# Patient Record
Sex: Female | Born: 1935 | Hispanic: Refuse to answer | Marital: Married | Smoking: Never smoker
Health system: Southern US, Community
[De-identification: ages and names within clinical notes are randomized; demographics above are authoritative.]

## PROBLEM LIST (undated history)

## (undated) DIAGNOSIS — I10 Essential (primary) hypertension: Secondary | ICD-10-CM

## (undated) DIAGNOSIS — E7800 Pure hypercholesterolemia, unspecified: Secondary | ICD-10-CM

## (undated) DIAGNOSIS — F0390 Unspecified dementia without behavioral disturbance: Secondary | ICD-10-CM

---

## 2004-04-03 ENCOUNTER — Ambulatory Visit: Payer: Self-pay | Admitting: Gastroenterology

## 2004-06-21 ENCOUNTER — Ambulatory Visit: Payer: Self-pay | Admitting: Internal Medicine

## 2004-12-20 ENCOUNTER — Ambulatory Visit: Payer: Self-pay | Admitting: Ophthalmology

## 2005-08-07 ENCOUNTER — Ambulatory Visit: Payer: Self-pay | Admitting: Internal Medicine

## 2005-09-23 ENCOUNTER — Ambulatory Visit: Payer: Self-pay | Admitting: Gerontology

## 2005-10-11 ENCOUNTER — Ambulatory Visit: Payer: Self-pay | Admitting: Internal Medicine

## 2006-11-06 ENCOUNTER — Ambulatory Visit: Payer: Self-pay | Admitting: Internal Medicine

## 2006-12-09 ENCOUNTER — Ambulatory Visit: Payer: Self-pay | Admitting: Cardiology

## 2007-04-07 ENCOUNTER — Ambulatory Visit: Payer: Self-pay | Admitting: Gastroenterology

## 2007-05-13 ENCOUNTER — Ambulatory Visit: Payer: Self-pay | Admitting: Internal Medicine

## 2007-11-27 ENCOUNTER — Ambulatory Visit: Payer: Self-pay

## 2008-02-24 ENCOUNTER — Ambulatory Visit: Payer: Self-pay | Admitting: Internal Medicine

## 2009-03-07 ENCOUNTER — Ambulatory Visit: Payer: Self-pay | Admitting: Internal Medicine

## 2010-01-16 ENCOUNTER — Ambulatory Visit: Payer: Self-pay | Admitting: Pain Medicine

## 2010-01-29 ENCOUNTER — Ambulatory Visit: Payer: Self-pay | Admitting: Pain Medicine

## 2010-02-08 ENCOUNTER — Ambulatory Visit: Payer: Self-pay | Admitting: Pain Medicine

## 2010-02-20 ENCOUNTER — Emergency Department: Payer: Self-pay | Admitting: Emergency Medicine

## 2010-02-20 ENCOUNTER — Ambulatory Visit: Payer: Self-pay | Admitting: Pain Medicine

## 2010-04-10 ENCOUNTER — Ambulatory Visit: Payer: Self-pay | Admitting: Ophthalmology

## 2010-05-08 ENCOUNTER — Ambulatory Visit: Payer: Self-pay | Admitting: Ophthalmology

## 2010-05-22 ENCOUNTER — Ambulatory Visit: Payer: Self-pay | Admitting: Cardiovascular Disease

## 2010-05-22 ENCOUNTER — Ambulatory Visit: Payer: Self-pay | Admitting: Ophthalmology

## 2010-10-05 ENCOUNTER — Ambulatory Visit: Payer: Self-pay | Admitting: Internal Medicine

## 2013-06-04 ENCOUNTER — Ambulatory Visit: Payer: Self-pay | Admitting: Internal Medicine

## 2016-11-13 ENCOUNTER — Other Ambulatory Visit: Payer: Self-pay | Admitting: Physician Assistant

## 2016-11-13 DIAGNOSIS — R1032 Left lower quadrant pain: Principal | ICD-10-CM

## 2016-11-13 DIAGNOSIS — R1031 Right lower quadrant pain: Secondary | ICD-10-CM

## 2016-11-19 ENCOUNTER — Ambulatory Visit
Admission: RE | Admit: 2016-11-19 | Discharge: 2016-11-19 | Disposition: A | Discharge status: Home / Self Care | Payer: Medicare HMO | Source: Ambulatory Visit | Attending: Internal Medicine | Admitting: Internal Medicine

## 2016-11-19 ENCOUNTER — Other Ambulatory Visit: Payer: Self-pay | Admitting: Internal Medicine

## 2016-11-19 ENCOUNTER — Other Ambulatory Visit: Payer: Self-pay | Admitting: Physician Assistant

## 2016-11-19 DIAGNOSIS — I2510 Atherosclerotic heart disease of native coronary artery without angina pectoris: Secondary | ICD-10-CM | POA: Diagnosis not present

## 2016-11-19 DIAGNOSIS — K5730 Diverticulosis of large intestine without perforation or abscess without bleeding: Secondary | ICD-10-CM | POA: Insufficient documentation

## 2016-11-19 DIAGNOSIS — R1031 Right lower quadrant pain: Secondary | ICD-10-CM | POA: Insufficient documentation

## 2016-11-19 DIAGNOSIS — M419 Scoliosis, unspecified: Secondary | ICD-10-CM | POA: Diagnosis not present

## 2016-11-19 DIAGNOSIS — I700 Atherosclerosis of aorta: Secondary | ICD-10-CM | POA: Diagnosis not present

## 2016-11-19 DIAGNOSIS — R1032 Left lower quadrant pain: Principal | ICD-10-CM

## 2016-11-19 DIAGNOSIS — R1011 Right upper quadrant pain: Secondary | ICD-10-CM

## 2016-11-20 ENCOUNTER — Ambulatory Visit: Payer: Medicare HMO

## 2016-12-02 ENCOUNTER — Ambulatory Visit: Payer: Self-pay

## 2017-10-27 ENCOUNTER — Ambulatory Visit
Admission: RE | Admit: 2017-10-27 | Discharge: 2017-10-27 | Disposition: A | Discharge status: Home / Self Care | Payer: Medicare HMO | Source: Ambulatory Visit | Attending: Internal Medicine | Admitting: Internal Medicine

## 2017-10-27 ENCOUNTER — Other Ambulatory Visit: Payer: Self-pay | Admitting: Internal Medicine

## 2017-10-27 DIAGNOSIS — R918 Other nonspecific abnormal finding of lung field: Secondary | ICD-10-CM | POA: Diagnosis not present

## 2017-10-27 DIAGNOSIS — I2510 Atherosclerotic heart disease of native coronary artery without angina pectoris: Secondary | ICD-10-CM | POA: Diagnosis not present

## 2017-10-27 DIAGNOSIS — I700 Atherosclerosis of aorta: Secondary | ICD-10-CM | POA: Diagnosis not present

## 2017-10-27 DIAGNOSIS — M2578 Osteophyte, vertebrae: Secondary | ICD-10-CM | POA: Diagnosis not present

## 2017-10-27 DIAGNOSIS — N323 Diverticulum of bladder: Secondary | ICD-10-CM | POA: Insufficient documentation

## 2017-10-27 DIAGNOSIS — I517 Cardiomegaly: Secondary | ICD-10-CM | POA: Diagnosis not present

## 2017-10-27 DIAGNOSIS — R1031 Right lower quadrant pain: Secondary | ICD-10-CM

## 2017-10-30 ENCOUNTER — Other Ambulatory Visit: Payer: Self-pay | Admitting: Internal Medicine

## 2017-10-30 DIAGNOSIS — R911 Solitary pulmonary nodule: Secondary | ICD-10-CM

## 2017-12-02 ENCOUNTER — Ambulatory Visit: Payer: Medicare HMO

## 2018-01-20 ENCOUNTER — Other Ambulatory Visit: Payer: Self-pay | Admitting: Physician Assistant

## 2018-01-20 ENCOUNTER — Ambulatory Visit
Admission: RE | Admit: 2018-01-20 | Discharge: 2018-01-20 | Disposition: A | Discharge status: Home / Self Care | Payer: Medicare HMO | Source: Ambulatory Visit | Attending: Physician Assistant | Admitting: Physician Assistant

## 2018-01-20 ENCOUNTER — Ambulatory Visit: Payer: Medicare HMO

## 2018-01-20 DIAGNOSIS — G319 Degenerative disease of nervous system, unspecified: Secondary | ICD-10-CM | POA: Insufficient documentation

## 2018-01-20 DIAGNOSIS — X58XXXA Exposure to other specified factors, initial encounter: Secondary | ICD-10-CM | POA: Diagnosis not present

## 2018-01-20 DIAGNOSIS — S0990XA Unspecified injury of head, initial encounter: Secondary | ICD-10-CM

## 2018-01-29 ENCOUNTER — Ambulatory Visit: Payer: Medicare HMO

## 2018-02-05 ENCOUNTER — Ambulatory Visit: Payer: Medicare HMO

## 2018-02-24 ENCOUNTER — Ambulatory Visit: Payer: Medicare HMO

## 2019-07-29 ENCOUNTER — Emergency Department: Payer: Medicare HMO

## 2019-07-29 ENCOUNTER — Encounter: Payer: Self-pay | Admitting: Emergency Medicine

## 2019-07-29 ENCOUNTER — Other Ambulatory Visit: Payer: Self-pay

## 2019-07-29 ENCOUNTER — Inpatient Hospital Stay
Admission: EM | Admit: 2019-07-29 | Discharge: 2019-08-03 | DRG: 558 | Disposition: A | Discharge status: Hospice | Payer: Medicare HMO | Attending: Internal Medicine | Admitting: Internal Medicine

## 2019-07-29 DIAGNOSIS — I2510 Atherosclerotic heart disease of native coronary artery without angina pectoris: Secondary | ICD-10-CM | POA: Diagnosis not present

## 2019-07-29 DIAGNOSIS — F0391 Unspecified dementia with behavioral disturbance: Secondary | ICD-10-CM | POA: Diagnosis not present

## 2019-07-29 DIAGNOSIS — E785 Hyperlipidemia, unspecified: Secondary | ICD-10-CM | POA: Diagnosis present

## 2019-07-29 DIAGNOSIS — M25559 Pain in unspecified hip: Secondary | ICD-10-CM | POA: Diagnosis not present

## 2019-07-29 DIAGNOSIS — W19XXXA Unspecified fall, initial encounter: Secondary | ICD-10-CM

## 2019-07-29 DIAGNOSIS — I10 Essential (primary) hypertension: Secondary | ICD-10-CM | POA: Diagnosis present

## 2019-07-29 DIAGNOSIS — R531 Weakness: Secondary | ICD-10-CM

## 2019-07-29 DIAGNOSIS — R296 Repeated falls: Secondary | ICD-10-CM | POA: Diagnosis present

## 2019-07-29 DIAGNOSIS — F03918 Unspecified dementia, unspecified severity, with other behavioral disturbance: Secondary | ICD-10-CM | POA: Diagnosis present

## 2019-07-29 DIAGNOSIS — K219 Gastro-esophageal reflux disease without esophagitis: Secondary | ICD-10-CM | POA: Diagnosis present

## 2019-07-29 DIAGNOSIS — E876 Hypokalemia: Secondary | ICD-10-CM | POA: Diagnosis not present

## 2019-07-29 DIAGNOSIS — E860 Dehydration: Secondary | ICD-10-CM | POA: Diagnosis present

## 2019-07-29 DIAGNOSIS — M6282 Rhabdomyolysis: Secondary | ICD-10-CM | POA: Diagnosis present

## 2019-07-29 DIAGNOSIS — F05 Delirium due to known physiological condition: Secondary | ICD-10-CM | POA: Diagnosis present

## 2019-07-29 DIAGNOSIS — R410 Disorientation, unspecified: Secondary | ICD-10-CM

## 2019-07-29 DIAGNOSIS — E7800 Pure hypercholesterolemia, unspecified: Secondary | ICD-10-CM | POA: Diagnosis present

## 2019-07-29 DIAGNOSIS — R627 Adult failure to thrive: Secondary | ICD-10-CM | POA: Diagnosis present

## 2019-07-29 DIAGNOSIS — Z66 Do not resuscitate: Secondary | ICD-10-CM | POA: Diagnosis present

## 2019-07-29 DIAGNOSIS — Z515 Encounter for palliative care: Secondary | ICD-10-CM | POA: Diagnosis not present

## 2019-07-29 DIAGNOSIS — M1611 Unilateral primary osteoarthritis, right hip: Secondary | ICD-10-CM | POA: Diagnosis present

## 2019-07-29 DIAGNOSIS — T796XXA Traumatic ischemia of muscle, initial encounter: Secondary | ICD-10-CM

## 2019-07-29 DIAGNOSIS — Z20822 Contact with and (suspected) exposure to covid-19: Secondary | ICD-10-CM | POA: Diagnosis present

## 2019-07-29 DIAGNOSIS — Z6827 Body mass index (BMI) 27.0-27.9, adult: Secondary | ICD-10-CM

## 2019-07-29 DIAGNOSIS — Z880 Allergy status to penicillin: Secondary | ICD-10-CM

## 2019-07-29 HISTORY — DX: Essential (primary) hypertension: I10

## 2019-07-29 HISTORY — DX: Pure hypercholesterolemia, unspecified: E78.00

## 2019-07-29 HISTORY — DX: Unspecified dementia, unspecified severity, without behavioral disturbance, psychotic disturbance, mood disturbance, and anxiety: F03.90

## 2019-07-29 LAB — BASIC METABOLIC PANEL
Anion gap: 12 (ref 5–15)
BUN: 19 mg/dL (ref 8–23)
CO2: 22 mmol/L (ref 22–32)
Calcium: 9.5 mg/dL (ref 8.9–10.3)
Chloride: 100 mmol/L (ref 98–111)
Creatinine, Ser: 0.82 mg/dL (ref 0.44–1.00)
GFR calc Af Amer: >60 mL/min (ref >60)
GFR calc non Af Amer: >60 mL/min (ref >60)
Glucose, Bld: 109 mg/dL — ABNORMAL HIGH (ref 70–99)
Potassium: 3.6 mmol/L (ref 3.5–5.1)
Sodium: 134 mmol/L — ABNORMAL LOW (ref 135–145)

## 2019-07-29 LAB — CBC
HCT: 40.1 % (ref 36.0–46.0)
Hemoglobin: 13.3 g/dL (ref 12.0–15.0)
MCH: 31.3 pg (ref 26.0–34.0)
MCHC: 33.2 g/dL (ref 30.0–36.0)
MCV: 94.4 fL (ref 80.0–100.0)
Platelets: 225 10*3/uL (ref 150–400)
RBC: 4.25 MIL/uL (ref 3.87–5.11)
RDW: 12.4 % (ref 11.5–15.5)
WBC: 6.4 10*3/uL (ref 4.0–10.5)
nRBC: 0 % (ref 0.0–0.2)

## 2019-07-29 LAB — URINALYSIS, COMPLETE (UACMP) WITH MICROSCOPIC
Bacteria, UA: NONE SEEN
Bilirubin Urine: NEGATIVE
Glucose, UA: NEGATIVE mg/dL
Hgb urine dipstick: NEGATIVE
Ketones, ur: 20 mg/dL — AB
Leukocytes,Ua: NEGATIVE
Nitrite: NEGATIVE
Protein, ur: NEGATIVE mg/dL
Specific Gravity, Urine: 1.019 (ref 1.005–1.030)
pH: 5 (ref 5.0–8.0)

## 2019-07-29 LAB — HEPATIC FUNCTION PANEL
ALT: 26 U/L (ref 0–44)
AST: 42 U/L — ABNORMAL HIGH (ref 15–41)
Albumin: 3.4 g/dL — ABNORMAL LOW (ref 3.5–5.0)
Alkaline Phosphatase: 68 U/L (ref 38–126)
Bilirubin, Direct: 0.3 mg/dL — ABNORMAL HIGH (ref 0.0–0.2)
Indirect Bilirubin: 1.5 mg/dL — ABNORMAL HIGH (ref 0.3–0.9)
Total Bilirubin: 1.8 mg/dL — ABNORMAL HIGH (ref 0.3–1.2)
Total Protein: 6.6 g/dL (ref 6.5–8.1)

## 2019-07-29 LAB — TSH: TSH: 5.297 u[IU]/mL — ABNORMAL HIGH (ref 0.350–4.500)

## 2019-07-29 LAB — PHOSPHORUS: Phosphorus: 2.6 mg/dL (ref 2.5–4.6)

## 2019-07-29 LAB — MAGNESIUM: Magnesium: 1.6 mg/dL — ABNORMAL LOW (ref 1.7–2.4)

## 2019-07-29 LAB — RESPIRATORY PANEL BY RT PCR (FLU A&B, COVID)
Influenza A by PCR: NEGATIVE
Influenza B by PCR: NEGATIVE
SARS Coronavirus 2 by RT PCR: NEGATIVE

## 2019-07-29 LAB — CK
Total CK: 1048 U/L — ABNORMAL HIGH (ref 38–234)
Total CK: 1075 U/L — ABNORMAL HIGH (ref 38–234)

## 2019-07-29 MED ORDER — HALOPERIDOL LACTATE 5 MG/ML IJ SOLN
2.0000 mg | Freq: Once | INTRAMUSCULAR | Status: AC
  Administered 2019-07-29: 2 mg via INTRAVENOUS
  Filled 2019-07-29: qty 1

## 2019-07-29 MED ORDER — LORAZEPAM 2 MG/ML IJ SOLN
0.5000 mg | Freq: Once | INTRAMUSCULAR | Status: AC
  Administered 2019-07-29: 17:00:00 0.5 mg via INTRAVENOUS
  Filled 2019-07-29: qty 1

## 2019-07-29 MED ORDER — SODIUM CHLORIDE 0.9 % IV SOLN: INTRAVENOUS | Status: AC

## 2019-07-29 MED ORDER — ACETAMINOPHEN 650 MG RE SUPP: 650.0000 mg | Freq: Four times a day (QID) | RECTAL | Status: DC | PRN

## 2019-07-29 MED ORDER — FENTANYL CITRATE (PF) 100 MCG/2ML IJ SOLN
50.0000 ug | Freq: Once | INTRAMUSCULAR | Status: AC
Start: 2019-07-29 — End: 2019-07-29
  Administered 2019-07-29: 50 ug via INTRAVENOUS
  Filled 2019-07-29: qty 2

## 2019-07-29 MED ORDER — SODIUM CHLORIDE 0.9 % IV BOLUS
1000.0000 mL | Freq: Once | INTRAVENOUS | Status: AC
  Administered 2019-07-29: 1000 mL via INTRAVENOUS

## 2019-07-29 MED ORDER — HYDROCODONE-ACETAMINOPHEN 5-325 MG PO TABS: 1.0000 | ORAL_TABLET | ORAL | Status: DC | PRN

## 2019-07-29 MED ORDER — ONDANSETRON HCL 4 MG/2ML IJ SOLN
4.0000 mg | Freq: Once | INTRAMUSCULAR | Status: AC
  Administered 2019-07-29: 15:00:00 4 mg via INTRAVENOUS
  Filled 2019-07-29: qty 2

## 2019-07-29 MED ORDER — ACETAMINOPHEN 325 MG PO TABS: 650.0000 mg | ORAL_TABLET | Freq: Four times a day (QID) | ORAL | Status: DC | PRN

## 2019-07-29 NOTE — ED Notes (Signed)
Pt found soiled in urine. Bed linens and clothing changed and pt placed in gown.

## 2019-07-29 NOTE — ED Provider Notes (Signed)
[facility] Emergency Department Provider Note  Time seen: 3:19 PM  I have reviewed the triage vital signs and the nursing notes.   HISTORY  Chief Complaint Fall  HPI Cindy James is a 86 y.o. female with a past medical history of mild dementia, hypertension, hyperlipidemia presents to the emergency department after a fall with right hip pain and confusion. According to the son she has mild dementia but lives alone, he checks on her usually twice a day. He states however yesterday she had a fall and was having a lot of pain trying to walk had a big bruise to her right thigh. He states he checked on her again at 545 this morning and found her on the ground, is not sure when she fell on the ground. He is also noted today that she appears to be having hallucinations or confusion at times. She is very restless here in the emergency department all of which the son states is not typical for her.   Past Medical History:  Diagnosis Date  . Dementia ([place])   . High cholesterol   . Hypertension     There are no problems to display for this patient.     Prior to Admission medications   Not on File    Allergies  Allergen Reactions  . Penicillins     Unknown reaction    No family history on file.  Social History Social History   Tobacco Use  . Smoking status: Not on file  Substance Use Topics  . Alcohol use: Not on file  . Drug use: Not on file    Review of Systems Unable to obtain adequate/accurate review of systems secondary to baseline dementia and increased confusion. ____________________________________________   PHYSICAL EXAM:  VITAL SIGNS: ED Triage Vitals  Enc Vitals Group     BP 07/29/19 1137 (!) 140/57     Pulse Rate 07/29/19 1137 63     Resp 07/29/19 1137 17     Temp 07/29/19 1137 98.4 F (36.9 C)     Temp src --      SpO2 07/29/19 1137 100 %     Weight 07/29/19 1138 151 lb (68.5 kg)     Height 07/29/19 1138 5\' 2"  (1.575 m)   Head Circumference --      Peak Flow --      Pain Score --      Pain Loc --      Pain Edu? --      Excl. in [place]? --    Constitutional: Patient is awake and alert she is quite restless and agitated at times attempting to pull off her mask and IV. Eyes: Normal exam ENT      Head: Normocephalic and atraumatic.      Mouth/Throat: Mucous membranes are moist. Cardiovascular: Normal rate, regular rhythm. Respiratory: Normal respiratory effort without tachypnea nor retractions. Breath sounds are clear Gastrointestinal: Soft and nontender. No distention.   Musculoskeletal: Pain with range of motion of right lower extremity, neurovascular intact distally. Patient does have a large bruise to her right lateral thigh. Neurologic:  Normal speech and language. Moves all extremities. Skin:  Skin is warm, dry Psychiatric: Restless, confused more than baseline per son.  ____________________________________________    EKG  EKG viewed and interpreted by myself shows what appears to be a sinus rhythm at 61 bpm with a narrow QRS, left axis deviation, slight QTC prolongation otherwise normal intervals, nonspecific ST changes.  ____________________________________________  RADIOLOGY  CT scan of the head and neck are negative for acute abnormality. Right hip x-ray and CT are negative for osseous abnormality.  ____________________________________________   INITIAL IMPRESSION / ASSESSMENT AND PLAN / ED COURSE  Pertinent labs & imaging results that were available during my care of the patient were reviewed by me and considered in my medical decision making (see chart for details).   Patient presents emergency department for 2 falls in less than 24 hours, found down overnight with increased confusion and possible hallucinations today and right hip pain. Patient does have a large bruise to the lateral aspect of her right thigh she does have pain with range of motion, neurovascular intact distally.  Patient is very fidgety and restless in the emergency department. Patient dose Ativan for restlessness.  Patient's work-up is largely negative her CT and x-ray of the hip are negative for acute fracture suspect the pain is likely related to the large hip/thigh contusion. Patient does appear to have rhabdomyolysis with a CK greater than 1000. Patient given IV fluids. Given the patient's increased confusion and possible hallucinations now 2 falls in less than 24 hours with rhabdomyolysis on work-up we will admit to the hospital service for further treatment. Patient and son agreeable to plan of care.  Cindy James was evaluated in Emergency Department on 07/29/2019 for the symptoms described in the history of present illness. She was evaluated in the context of the global COVID-19 pandemic, which necessitated consideration that the patient might be at risk for infection with the SARS-CoV-2 virus that causes COVID-19. Institutional protocols and algorithms that pertain to the evaluation of patients at risk for COVID-19 are in a state of rapid change based on information released by regulatory bodies including the CDC and federal and state organizations. These policies and algorithms were followed during the patient's care in the ED.  ____________________________________________   FINAL CLINICAL IMPRESSION(S) / ED DIAGNOSES  Confusion Hallucinations Fall Right hip pain Rhabdomyolysis   Minna Antis, MD 07/29/19 1919

## 2019-07-29 NOTE — H&P (Addendum)
Cindy James YKD:[id number] DOB: July 16, 1926 DOA: 07/29/2019    PCP: Kirk Ruths, MD   Outpatient Specialists:   CARDS:   Olen Cordial, MD    Patient arrived to ER on 07/29/19 at 1124  Patient coming from: home Lives alone,  Family lives next door    Chief Complaint:   Chief Complaint  Patient presents with  . Fall    HPI: Cindy James is a 86 y.o. female with medical history significant of dementia, CAD, carotid artery disease, HLD, GERD, HTN, B12    Presented with  Fall since yesterday now with bruise of the right thigh, she has dementia at baseline she has been very unsteady with ambulation lately. Fall likely occurred this morning she was found around 5:45 AM last was seen at 8 PM when patient was trying to dress up in the dark. She have had 3 falls this week. Fr the past few weeks she has been hurting everywhere she has hard time getting up. Have been incontinent and have been tremulous this morning. She at baseline walks independently Patient has been reporting multiple area of tenderness likely secondary to repeated falls She has been increasing demented and have been hallucinating lately. She has been reaching out to things that are not there. Family gave her tylenol with no improvement.  Family have not had any exposure  Family report patient have gotten her 2n Levan Hurst shot 2 days ago.  He family have had their second shot and have tolerated it well.   She have had diarrhea since Saturday yesterday she have had at least 3 BM's  Infectious risk factors:  Reports fatigue     In  ER   COVID TEST  NEGATIVE  Lab Results  Component Value Date   [place] NEGATIVE 07/29/2019     Regarding pertinent Chronic problems:     Hyperlipidemia - on statins zocor   HTN on Coreg   Dementia - aricept    CAD  - On Aspirin, statin, betablocker,                 - followed by cardiology    While in ER: Imaging showed no evidence of  fracture Noted to have elevated CK above   The following Work up has been ordered so far:  Orders Placed This Encounter  Procedures  . Respiratory Panel by RT PCR (Flu A&B, Covid) - Nasopharyngeal Swab  . CT Head Wo Contrast  . CT Cervical Spine Wo Contrast  . DG HIP UNILAT W OR W/O PELVIS 2-3 VIEWS RIGHT  . CT Hip Right Wo Contrast  . Basic metabolic panel  . CBC  . Urinalysis, Complete w Microscopic  . CK  . In and Out Cath  . Consult to hospitalist  ALL PATIENTS BEING ADMITTED/HAVING PROCEDURES NEED COVID-19 SCREENING  . EKG 12-Lead  . ED EKG  . Saline lock IV    Following Medications were ordered in ER: Medications  fentaNYL (SUBLIMAZE) injection 50 mcg (50 mcg Intravenous Given 07/29/19 1527)  ondansetron (ZOFRAN) injection 4 mg (4 mg Intravenous Given 07/29/19 1527)  LORazepam (ATIVAN) injection 0.5 mg (0.5 mg Intravenous Given 07/29/19 1638)  sodium chloride 0.9 % bolus 1,000 mL (1,000 mLs Intravenous New Bag/Given 07/29/19 1637)  haloperidol lactate (HALDOL) injection 2 mg (2 mg Intravenous Given 07/29/19 1759)        Consult Orders  (From admission, onward)         Start  Ordered   07/29/19 1845  Consult to hospitalist  ALL PATIENTS BEING ADMITTED/HAVING PROCEDURES NEED COVID-19 SCREENING  Once    Comments: ALL PATIENTS BEING ADMITTED/HAVING PROCEDURES NEED COVID-19 SCREENING  Provider:  (Not yet assigned)  Question Answer Comment  Place call to: [facility]   Reason for Consult Admit   Diagnosis/Clinical Info for Consult: 2 falls, rhabdo, ambulation issues and hallucinations      07/29/19 1845         Significant initial  Findings: Abnormal Labs Reviewed  BASIC METABOLIC PANEL - Abnormal; Notable for the following components:      Result Value   Sodium 134 (*)    Glucose, Bld 109 (*)    All other components within normal limits  URINALYSIS, COMPLETE (UACMP) WITH MICROSCOPIC - Abnormal; Notable for the following components:   Color, Urine YELLOW (*)     APPearance HAZY (*)    Ketones, ur 20 (*)    All other components within normal limits  CK - Abnormal; Notable for the following components:   Total CK 1,048 (*)    All other components within normal limits    Otherwise labs showing:    Recent Labs  Lab 07/29/19 1158  NA 134*  K 3.6  CO2 22  GLUCOSE 109*  BUN 19  CREATININE 0.82  CALCIUM 9.5    Cr   stable,    Lab Results  Component Value Date   CREATININE 0.82 07/29/2019       WBC       Component Value Date/Time   WBC 6.4 07/29/2019 1158    Plt: Lab Results  Component Value Date   PLT 225 07/29/2019      HG/HCT  Stable     Component Value Date/Time   HGB 13.3 07/29/2019 1158   HCT 40.1 07/29/2019 1158    Cardiac Panel (last 3 results) Recent Labs    07/29/19 1158  CKTOTAL 1,048*       ECG: Ordered Personally reviewed by me showing: HR : 61 Rhythm:  NSR, left fascicular block   no evidence of ischemic changes QTC 511    UA  no evidence of UTI     Urine analysis:    Component Value Date/Time   COLORURINE YELLOW (A) 07/29/2019 1603   APPEARANCEUR HAZY (A) 07/29/2019 1603   LABSPEC 1.019 07/29/2019 1603   PHURINE 5.0 07/29/2019 1603   GLUCOSEU NEGATIVE 07/29/2019 1603   HGBUR NEGATIVE 07/29/2019 1603   BILIRUBINUR NEGATIVE 07/29/2019 1603   KETONESUR 20 (A) 07/29/2019 1603   PROTEINUR NEGATIVE 07/29/2019 1603   NITRITE NEGATIVE 07/29/2019 1603   LEUKOCYTESUR NEGATIVE 07/29/2019 1603   Ordered  CT HEAD   NON acute  CXR -  NON acute Hip imaging plain film as well as CT scan showed no fracture    ED Triage Vitals  Enc Vitals Group     BP 07/29/19 1137 (!) 140/57     Pulse Rate 07/29/19 1137 63     Resp 07/29/19 1137 17     Temp 07/29/19 1137 98.4 F (36.9 C)     Temp src --      SpO2 07/29/19 1137 100 %     Weight 07/29/19 1138 151 lb (68.5 kg)     Height 07/29/19 1138 5\' 2"  (1.575 m)     Head Circumference --      Peak Flow --      Pain Score --      Pain Loc --  Pain Edu?  --      Excl. in [place]? --   TMAX(24)@       Latest  Blood pressure (!) 140/57, pulse 63, temperature 98.4 F (36.9 C), resp. rate 17, height 5\' 2"  (1.575 m), weight 68.5 kg, SpO2 100 %.    Hospitalist was called for admission for rhabdomyolysis  and dehydration   Review of Systems:    Pertinent positives include:  Fatigue, multiple falls  Constitutional:  No weight loss, night sweats, Fevers, chills weight loss  HEENT:  No headaches, Difficulty swallowing,Tooth/dental problems,Sore throat,  No sneezing, itching, ear ache, nasal congestion, post nasal drip,  Cardio-vascular:  No chest pain, Orthopnea, PND, anasarca, dizziness, palpitations.no Bilateral lower extremity swelling  GI:  No heartburn, indigestion, abdominal pain, nausea, vomiting, diarrhea, change in bowel habits, loss of appetite, melena, blood in stool, hematemesis Resp:  no shortness of breath at rest. No dyspnea on exertion, No excess mucus, no productive cough, No non-productive cough, No coughing up of blood.No change in color of mucus.No wheezing. Skin:  no rash or lesions. No jaundice GU:  no dysuria, change in color of urine, no urgency or frequency. No straining to urinate.  No flank pain.  Musculoskeletal:  No joint pain or no joint swelling. No decreased range of motion. No back pain.  Psych:  No change in mood or affect. No depression or anxiety. No memory loss.  Neuro: no localizing neurological complaints, no tingling, no weakness, no double vision, no gait abnormality, no slurred speech, no confusion  All systems reviewed and apart from HOPI all are negative  Past Medical History:   Past Medical History:  Diagnosis Date  . Dementia ([facility])   . High cholesterol   . Hypertension       History reviewed. No pertinent surgical history.  Social History:  Ambulatory   independently      has no history on file for tobacco, alcohol, and drug.    Family History:   History reviewed. No pertinent  family history.  Allergies: Allergies  Allergen Reactions  . Penicillins     Unknown reaction     Prior to Admission medications   Not on File   Physical Exam: Blood pressure (!) 140/57, pulse 63, temperature 98.4 F (36.9 C), resp. rate 17, height 5\' 2"  (1.575 m), weight 68.5 kg, SpO2 100 %. 1. General:  in No  Acute distress  Chronically ill-appearing 2. Psychological: Alert and   Oriented to self 3. Head/ENT:     Dry Mucous Membranes                          Head Non traumatic, neck supple                         Poor Dentition 4. SKIN:   decreased Skin turgor,  Skin clean Dry and intact no rash 5. Heart: Regular rate and rhythm no  Murmur, no Rub or gallop 6. Lungs:  no wheezes or crackles   7. Abdomen: Soft, non-tender, Non distended  bowel sounds present 8. Lower extremities: no clubbing, cyanosis, no  edema 9. Neurologically Grossly intact, moving all 4 extremities equally   10. MSK: Normal range of motion   All other LABS:     Recent Labs  Lab 07/29/19 1158  WBC 6.4  HGB 13.3  HCT 40.1  MCV 94.4  PLT 225     Recent Labs  Lab 07/29/19 1158  NA 134*  K 3.6  CL 100  CO2 22  GLUCOSE 109*  BUN 19  CREATININE 0.82  CALCIUM 9.5     No results for input(s): AST, ALT, ALKPHOS, BILITOT, PROT, ALBUMIN in the last 168 hours.     Cultures: No results found for: SDES, SPECREQUEST, CULT, REPTSTATUS   Radiological Exams on Admission: CT Head Wo Contrast  Result Date: 07/29/2019 CLINICAL DATA:  86 year old female with a history of a fall EXAM: CT HEAD WITHOUT CONTRAST CT CERVICAL SPINE WITHOUT CONTRAST TECHNIQUE: Multidetector CT imaging of the head and cervical spine was performed following the standard protocol without intravenous contrast. Multiplanar CT image reconstructions of the cervical spine were also generated. COMPARISON:  01/20/2018 FINDINGS: CT HEAD FINDINGS Brain: No acute intracranial hemorrhage. No midline shift or mass effect. Gray-white  differentiation maintained. Mild volume loss. Confluent hypodensity in the periventricular white matter, similar to the comparison. Similar configuration of the ventricles. Unremarkable appearance of the ventricular system. Vascular: Calcifications of the anterior vasculature. Skull: No acute fracture.  No aggressive bone lesion identified. Sinuses/Orbits: Unremarkable appearance of the orbits. Mastoid air cells clear. No middle ear effusion. No significant sinus disease. Other: None CT CERVICAL SPINE FINDINGS Alignment: Craniocervical junction aligned. Anatomic alignment of the cervical elements. No subluxation. Facets aligned. Skull base and vertebrae: No acute fracture at the skullbase. Vertebral body heights relatively maintained. No acute fracture identified. Soft tissues and spinal canal: Bilateral carotid calcifications. No focal soft tissue swelling. Disc levels: Disc space narrowing throughout the cervical spine. Endplate changes are most pronounced at C2-C3 and C6-C7. Mild uncovertebral joint disease and facet disease of the cervical spine. No bony canal narrowing. Upper chest: Unremarkable appearance of the lung apices. Other: No bony canal narrowing. IMPRESSION: Head CT: No acute intracranial abnormality. Chronic microvascular ischemic disease with associated intracranial atherosclerosis. Cervical CT: No acute fracture or malalignment of the cervical spine. Multilevel degenerative changes. Carotid calcifications. Electronically Signed   By: Gilmer Mor D.O.   On: 07/29/2019 12:25   CT Cervical Spine Wo Contrast  Result Date: 07/29/2019 CLINICAL DATA:  86 year old female with a history of a fall EXAM: CT HEAD WITHOUT CONTRAST CT CERVICAL SPINE WITHOUT CONTRAST TECHNIQUE: Multidetector CT imaging of the head and cervical spine was performed following the standard protocol without intravenous contrast. Multiplanar CT image reconstructions of the cervical spine were also generated. COMPARISON:   01/20/2018 FINDINGS: CT HEAD FINDINGS Brain: No acute intracranial hemorrhage. No midline shift or mass effect. Gray-white differentiation maintained. Mild volume loss. Confluent hypodensity in the periventricular white matter, similar to the comparison. Similar configuration of the ventricles. Unremarkable appearance of the ventricular system. Vascular: Calcifications of the anterior vasculature. Skull: No acute fracture.  No aggressive bone lesion identified. Sinuses/Orbits: Unremarkable appearance of the orbits. Mastoid air cells clear. No middle ear effusion. No significant sinus disease. Other: None CT CERVICAL SPINE FINDINGS Alignment: Craniocervical junction aligned. Anatomic alignment of the cervical elements. No subluxation. Facets aligned. Skull base and vertebrae: No acute fracture at the skullbase. Vertebral body heights relatively maintained. No acute fracture identified. Soft tissues and spinal canal: Bilateral carotid calcifications. No focal soft tissue swelling. Disc levels: Disc space narrowing throughout the cervical spine. Endplate changes are most pronounced at C2-C3 and C6-C7. Mild uncovertebral joint disease and facet disease of the cervical spine. No bony canal narrowing. Upper chest: Unremarkable appearance of the lung apices. Other: No bony canal narrowing. IMPRESSION: Head CT: No acute intracranial abnormality. Chronic microvascular ischemic disease  with associated intracranial atherosclerosis. Cervical CT: No acute fracture or malalignment of the cervical spine. Multilevel degenerative changes. Carotid calcifications. Electronically Signed   By: Gilmer Mor D.O.   On: 07/29/2019 12:25   CT Hip Right Wo Contrast  Result Date: 07/29/2019 CLINICAL DATA:  Acute hip pain EXAM: CT OF THE RIGHT HIP WITHOUT CONTRAST TECHNIQUE: Multidetector CT imaging of the right hip was performed according to the standard protocol. Multiplanar CT image reconstructions were also generated. COMPARISON:   None. FINDINGS: Bones/Joint/Cartilage No fracture or dislocation. Moderate right hip osteoarthritis is seen with superior joint space loss and marginal osteophyte formation. There is diffuse osteopenia. There is sclerosis around the sacroiliac joint and anterior pubic symphysis. No large hip joint effusion is seen. Ligaments Suboptimally assessed by CT. Muscles and Tendons Mild fatty atrophy noted within the muscles surrounding the hip. The tendons appear to be grossly intact. Soft tissues No focal soft tissue swelling is seen. There are dense vascular calcifications. The visualized portion of the deep pelvis is grossly unremarkable. IMPRESSION: No acute osseous abnormality.  Moderate right hip osteoarthritis. Electronically Signed   By: Jonna Clark M.D.   On: 07/29/2019 17:56   DG HIP UNILAT W OR W/O PELVIS 2-3 VIEWS RIGHT  Result Date: 07/29/2019 CLINICAL DATA:  86 year old female with fall and right hip pain. EXAM: DG HIP (WITH OR WITHOUT PELVIS) 2-3V RIGHT COMPARISON:  CT of the abdomen pelvis dated 10/27/2017. FINDINGS: There is no acute fracture or dislocation. The bones are osteopenic. There is arthritic changes of the right hip with severe joint space narrowing with near bone-on-bone contact. There is degenerative changes of the visualized lower lumbar spine with scoliosis. The soft tissues are unremarkable. IMPRESSION: 1. No acute fracture or dislocation. 2. Degenerative changes of the right hip with severe narrowing of the joint space. Electronically Signed   By: Elgie Collard M.D.   On: 07/29/2019 15:57    Chart has been reviewed    Assessment/Plan   86 y.o. female with medical history significant of dementia, CAD, carotid artery disease, HLD, GERD, HTN, B12 Admitted for  Rhabdomyolysis  and dehydration    Present on Admission:  . Rhabdomyolysis in the setting of spanning 1.  Time on the floor.  Patient have had diffuse pain.  Of note patient recently have had second dose of moderna  vaccination. We will rehydrate and follow CK  . CAD (coronary artery disease) -chronic stable restart home medications when able to tolerate  . Falls -will need PT OT assessment prior to discharge and possibly placement or home health   . Hip pain -imaging showed no evidence of fracture probably benefit from PT OT   . Dementia with behavioral disturbance ([facility]) -expect some degree of sundowning while hospitalized  Prolonged QT -repeat EKG check electrolytes avoid QT prolonging medications Other plan as per orders.  TSH elevated -  Will check T4 and T3 if evidence of hypothyroidism will need to initiate treatment  DVT prophylaxis:  SCD     Code Status:    DNR/DNI   as per  family  I had personally discussed CODE STATUS with  Family     Family Communication:   Family not at  Bedside  plan of care was discussed on the phone with  Daughter in [place],    Disposition Plan:     likely will need placement for rehabilitation  Would benefit from PT/OT eval prior to DC  Ordered                   Swallow eval - SLP ordered                   Social Work  consulted     Admission status:  ED Disposition    None      Obs     Level of care      tele  For 24H   Precautions: admitted as  PUI  No active isolations  If Covid PCR is negative  - please DC precautions     PPE: Used by the provider:   P100  eye Goggles,  Gloves       Ezechiel Stooksbury 07/29/2019, 9:15 PM    [facility]     after 2 AM please page floor coverage PA If 7AM-7PM, please contact the day team taking care of the patient using [email or web address]   Patient was evaluated in the context of the global COVID-19 pandemic, which necessitated consideration that the patient might be at risk for infection with the SARS-CoV-2 virus that causes COVID-19. Institutional protocols and algorithms that pertain to the evaluation of patients at risk for COVID-19 are in a state of rapid  change based on information released by regulatory bodies including the CDC and federal and state organizations. These policies and algorithms were followed during the patient's care.

## 2019-07-29 NOTE — ED Notes (Signed)
Pt's son leaving for the night and gave this RN a number to call if needed [phone]

## 2019-07-29 NOTE — ED Notes (Signed)
Report given to Kathryn RN

## 2019-07-29 NOTE — ED Notes (Signed)
Pt otf for imaging 

## 2019-07-29 NOTE — ED Notes (Signed)
Pt's daughter in law Albin Felling updated with son's permission

## 2019-07-29 NOTE — ED Notes (Signed)
Attempted to call report to floor, the nurse taking the patient states she will call me back

## 2019-07-29 NOTE — ED Notes (Signed)
Pt laying in bed in NAD and family member at bedside. Pt c/o pain in multiple areas and pain is in a different area every time I assess the patient, no obvious deformities noted. Family member reports she fell this morning while trying to put on her pants.

## 2019-07-29 NOTE — ED Triage Notes (Signed)
Patient presents to the ED post fall.  Patient denies pain, but son states patient is very tender when moved and patient has a bruise to her right thigh.  Patient fell yesterday as well.  Patient has dementia and having some hallucinations over the past year.  Patient's son found patient on the ground this morning at 5:45am.  Patient was last seen sleeping in bed at 8pm the night before.  Patient has become very unsteady walking for the past two days per son.  Son states is appeared that patient had been trying to get dressed but all lights were off.

## 2019-07-29 NOTE — ED Notes (Signed)
Soft mittens placed on pt for safety as pt continues to try and pull her IV out. IV wrapped in kerlex.

## 2019-07-30 DIAGNOSIS — M1611 Unilateral primary osteoarthritis, right hip: Secondary | ICD-10-CM | POA: Diagnosis present

## 2019-07-30 DIAGNOSIS — Z515 Encounter for palliative care: Secondary | ICD-10-CM | POA: Diagnosis not present

## 2019-07-30 DIAGNOSIS — R296 Repeated falls: Secondary | ICD-10-CM | POA: Diagnosis present

## 2019-07-30 DIAGNOSIS — W19XXXD Unspecified fall, subsequent encounter: Secondary | ICD-10-CM

## 2019-07-30 DIAGNOSIS — M6282 Rhabdomyolysis: Secondary | ICD-10-CM | POA: Diagnosis present

## 2019-07-30 DIAGNOSIS — I2510 Atherosclerotic heart disease of native coronary artery without angina pectoris: Secondary | ICD-10-CM

## 2019-07-30 DIAGNOSIS — E860 Dehydration: Secondary | ICD-10-CM | POA: Diagnosis present

## 2019-07-30 DIAGNOSIS — R627 Adult failure to thrive: Secondary | ICD-10-CM | POA: Diagnosis present

## 2019-07-30 DIAGNOSIS — Z880 Allergy status to penicillin: Secondary | ICD-10-CM | POA: Diagnosis not present

## 2019-07-30 DIAGNOSIS — E7800 Pure hypercholesterolemia, unspecified: Secondary | ICD-10-CM | POA: Diagnosis present

## 2019-07-30 DIAGNOSIS — E785 Hyperlipidemia, unspecified: Secondary | ICD-10-CM | POA: Diagnosis present

## 2019-07-30 DIAGNOSIS — K219 Gastro-esophageal reflux disease without esophagitis: Secondary | ICD-10-CM | POA: Diagnosis present

## 2019-07-30 DIAGNOSIS — F05 Delirium due to known physiological condition: Secondary | ICD-10-CM | POA: Diagnosis present

## 2019-07-30 DIAGNOSIS — Z66 Do not resuscitate: Secondary | ICD-10-CM | POA: Diagnosis present

## 2019-07-30 DIAGNOSIS — Z20822 Contact with and (suspected) exposure to covid-19: Secondary | ICD-10-CM | POA: Diagnosis present

## 2019-07-30 DIAGNOSIS — E876 Hypokalemia: Secondary | ICD-10-CM | POA: Diagnosis not present

## 2019-07-30 DIAGNOSIS — T796XXD Traumatic ischemia of muscle, subsequent encounter: Secondary | ICD-10-CM | POA: Diagnosis not present

## 2019-07-30 DIAGNOSIS — R531 Weakness: Secondary | ICD-10-CM | POA: Diagnosis not present

## 2019-07-30 DIAGNOSIS — F0391 Unspecified dementia with behavioral disturbance: Secondary | ICD-10-CM | POA: Diagnosis present

## 2019-07-30 DIAGNOSIS — I10 Essential (primary) hypertension: Secondary | ICD-10-CM | POA: Diagnosis present

## 2019-07-30 DIAGNOSIS — Z6827 Body mass index (BMI) 27.0-27.9, adult: Secondary | ICD-10-CM | POA: Diagnosis not present

## 2019-07-30 LAB — COMPREHENSIVE METABOLIC PANEL
ALT: 22 U/L (ref 0–44)
AST: 36 U/L (ref 15–41)
Albumin: 3.1 g/dL — ABNORMAL LOW (ref 3.5–5.0)
Alkaline Phosphatase: 61 U/L (ref 38–126)
Anion gap: 9 (ref 5–15)
BUN: 16 mg/dL (ref 8–23)
CO2: 22 mmol/L (ref 22–32)
Calcium: 8.6 mg/dL — ABNORMAL LOW (ref 8.9–10.3)
Chloride: 107 mmol/L (ref 98–111)
Creatinine, Ser: 0.72 mg/dL (ref 0.44–1.00)
GFR calc Af Amer: >60 mL/min (ref >60)
GFR calc non Af Amer: >60 mL/min (ref >60)
Glucose, Bld: 98 mg/dL (ref 70–99)
Potassium: 3.4 mmol/L — ABNORMAL LOW (ref 3.5–5.1)
Sodium: 138 mmol/L (ref 135–145)
Total Bilirubin: 1.7 mg/dL — ABNORMAL HIGH (ref 0.3–1.2)
Total Protein: 6.1 g/dL — ABNORMAL LOW (ref 6.5–8.1)

## 2019-07-30 LAB — CBC
HCT: 34.9 % — ABNORMAL LOW (ref 36.0–46.0)
Hemoglobin: 11.6 g/dL — ABNORMAL LOW (ref 12.0–15.0)
MCH: 31.5 pg (ref 26.0–34.0)
MCHC: 33.2 g/dL (ref 30.0–36.0)
MCV: 94.8 fL (ref 80.0–100.0)
Platelets: 221 10*3/uL (ref 150–400)
RBC: 3.68 MIL/uL — ABNORMAL LOW (ref 3.87–5.11)
RDW: 12.5 % (ref 11.5–15.5)
WBC: 6.3 10*3/uL (ref 4.0–10.5)
nRBC: 0 % (ref 0.0–0.2)

## 2019-07-30 LAB — VITAMIN B12: Vitamin B-12: 725 pg/mL (ref 180–914)

## 2019-07-30 LAB — PHOSPHORUS: Phosphorus: 2.3 mg/dL — ABNORMAL LOW (ref 2.5–4.6)

## 2019-07-30 LAB — CK: Total CK: 996 U/L — ABNORMAL HIGH (ref 38–234)

## 2019-07-30 LAB — MAGNESIUM: Magnesium: 1.8 mg/dL (ref 1.7–2.4)

## 2019-07-30 LAB — TSH: TSH: 3.094 u[IU]/mL (ref 0.350–4.500)

## 2019-07-30 MED ORDER — LORAZEPAM 2 MG/ML IJ SOLN
0.5000 mg | Freq: Once | INTRAMUSCULAR | Status: AC
  Administered 2019-07-30: 0.5 mg via INTRAVENOUS
  Filled 2019-07-30: qty 1

## 2019-07-30 MED ORDER — MORPHINE SULFATE (CONCENTRATE) 10 MG/0.5ML PO SOLN
5.0000 mg | ORAL | Status: DC | PRN
  Administered 2019-07-30: 5 mg via ORAL
  Filled 2019-07-30: qty 0.5

## 2019-07-30 MED ORDER — POTASSIUM CHLORIDE 20 MEQ PO PACK: 40.0000 meq | PACK | Freq: Once | ORAL | Status: DC

## 2019-07-30 MED ORDER — MORPHINE SULFATE (CONCENTRATE) 10 MG/0.5ML PO SOLN
5.0000 mg | ORAL | Status: DC | PRN
  Administered 2019-07-31: 5 mg via ORAL
  Filled 2019-07-30 (×2): qty 0.5

## 2019-07-30 MED ORDER — ORAL CARE MOUTH RINSE
15.0000 mL | Freq: Two times a day (BID) | OROMUCOSAL | Status: DC
  Administered 2019-07-30 – 2019-08-03 (×6): 15 mL via OROMUCOSAL

## 2019-07-30 MED ORDER — HALOPERIDOL LACTATE 5 MG/ML IJ SOLN
1.0000 mg | Freq: Four times a day (QID) | INTRAMUSCULAR | Status: DC | PRN
  Administered 2019-07-30: 1 mg via INTRAVENOUS
  Filled 2019-07-30: qty 1

## 2019-07-30 MED ORDER — MORPHINE SULFATE (CONCENTRATE) 10 MG/0.5ML PO SOLN
5.0000 mg | Freq: Four times a day (QID) | ORAL | Status: DC
  Administered 2019-07-30 – 2019-08-01 (×5): 5 mg via ORAL
  Filled 2019-07-30 (×5): qty 0.5

## 2019-07-30 MED ORDER — ENSURE ENLIVE PO LIQD
237.0000 mL | Freq: Two times a day (BID) | ORAL | Status: DC
  Administered 2019-07-31 – 2019-08-03 (×4): 237 mL via ORAL

## 2019-07-30 NOTE — Progress Notes (Signed)
IS education attempted, pt confused unable to understand/comprehend instructions

## 2019-07-30 NOTE — Progress Notes (Signed)
[facility] [place] NAME: Cindy James    MR#:  [id number]  DATE OF BIRTH:  1926-10-27  SUBJECTIVE:  CHIEF COMPLAINT:   Chief Complaint  Patient presents with  . Fall  confused, agitated REVIEW OF SYSTEMS:  ROSunable to provide due to mental state DRUG ALLERGIES:   Allergies  Allergen Reactions  . Codeine Other (See Comments)  . Penicillins     Unknown reaction  . Sulfa Antibiotics Nausea Only and Nausea And Vomiting  . Tramadol Other (See Comments)   VITALS:  Blood pressure 94/65, pulse 99, temperature 98.4 F (36.9 C), temperature source Oral, resp. rate 20, height 5\' 2"  (1.575 m), weight 68.5 kg, SpO2 93 %. PHYSICAL EXAMINATION:  Physical Exam Constitutional:      Appearance: She is ill-appearing.  HENT:     Head: Normocephalic and atraumatic.  Eyes:     Conjunctiva/sclera: Conjunctivae normal.     Pupils: Pupils are equal, round, and reactive to light.  Neck:     Thyroid: No thyromegaly.     Trachea: No tracheal deviation.  Cardiovascular:     Rate and Rhythm: Normal rate and regular rhythm.     Heart sounds: Normal heart sounds.  Pulmonary:     Effort: Pulmonary effort is normal. No respiratory distress.     Breath sounds: Normal breath sounds. No wheezing.  Chest:     Chest wall: No tenderness.  Abdominal:     General: Bowel sounds are normal. There is no distension.     Palpations: Abdomen is soft.     Tenderness: There is no abdominal tenderness.  Musculoskeletal:        General: Normal range of motion.     Cervical back: Normal range of motion and neck supple.  Skin:    General: Skin is warm and dry.     Findings: No rash.  Neurological:     Mental Status: She is alert. She is disoriented and confused.     Cranial Nerves: No cranial nerve deficit.  Psychiatric:        Behavior: Behavior is agitated.    LABORATORY PANEL:  Female CBC Recent Labs  Lab 07/30/19 0513  WBC 6.3  HGB 11.6*  HCT 34.9*  PLT 221    ------------------------------------------------------------------------------------------------------------------ Chemistries  Recent Labs  Lab 07/30/19 0513  NA 138  K 3.4*  CL 107  CO2 22  GLUCOSE 98  BUN 16  CREATININE 0.72  CALCIUM 8.6*  MG 1.8  AST 36  ALT 22  ALKPHOS 61  BILITOT 1.7*   RADIOLOGY:  No results found. ASSESSMENT AND PLAN:  86 y.o. female with medical history significant of dementia, CAD, carotid artery disease, HLD, GERD, HTN, B12 Admitted for  Rhabdomyolysis  and dehydration    Present on Admission:  . Rhabdomyolysis  . CAD (coronary artery disease)  . Falls  . Hip pain  . Dementia with behavioral disturbance (HCC)  Prolonged QT TSH elevated   After long d/w son Cindy James over phone - decision was made to keep her comfortable - she is Hospice Home appropriate. I've requested TOC team and hospice liaison to coordinate transfer to hospice home when bed becomes available (none today)   DVT prophylaxis: None ( comfort care) Family Communication: d/w son Cindy James over phone Disposition Plan:  Came from [place] go back home, family is unable to take care of her (total care) Barriers to [facility] Bed Availability   All the records are  reviewed and case discussed with Care Management/Social Worker. Management plans discussed with the patient, family and they are in agreement.  CODE STATUS: DNR  TOTAL TIME TAKING CARE OF THIS PATIENT: 35 minutes.   More than 50% of the time was spent in counseling/coordination of care: YES  POSSIBLE D/C IN 1-2 DAYS, DEPENDING ON Hospice Home beds availability   Delfino Lovett M.D on 07/30/2019 at 9:41 PM  [facility]   CC: Primary care physician; Lauro Regulus, MD  Note: This dictation was prepared with Dragon dictation along with smaller phrase technology. Any transcriptional errors that result from this process are unintentional.

## 2019-07-30 NOTE — Evaluation (Signed)
Occupational Therapy Evaluation Patient Details Name: Cindy James MRN: [id number] DOB: 02-08-1927 Today's Date: 07/30/2019    History of Present Illness Pt is 86 y/o F who presented to ER secondary to fall with R hip pain (and associated ecchymosis); admitted for management of rhabdomyolysis.  R hip imaging negative.   Clinical Impression   Pt was seen for OT evaluation this date. Prior to hospital admission, pt was apparently living alone and walking with RW per chart. Her son is involved in care and would check on pt and is who found her after fall. Currently pt demonstrates impairments as described below (See OT problem list) which functionally limit her ability to perform ADL/self-care tasks. Pt currently requires MIN to MOD A for UB ADLs, MIN/MOD A for sit<>stand transfers, and MAX to total A at bed level for LB ADLs/toileting.  Pt would benefit from skilled OT to address noted impairments and functional limitations (see below for any additional details) in order to maximize safety and independence while minimizing falls risk and caregiver burden. Upon hospital discharge, recommend SNF to maximize pt safety and return to PLOF.     Follow Up Recommendations  SNF;Supervision/Assistance - 24 hour    Equipment Recommendations  Other (comment)(defer to next level of care)    Recommendations for Other Services       Precautions / Restrictions Precautions Precautions: Fall Restrictions Weight Bearing Restrictions: No      Mobility Bed Mobility Overal bed mobility: Needs Assistance Bed Mobility: Rolling;Supine to Sit;Sit to Supine Rolling: Total assist;+2 for physical assistance   Supine to sit: Max assist Sit to supine: Mod assist   General bed mobility comments: hand-over-hand to initiate movement of LEs towards edge of bed and truncal elevation; does maintain static sitting with cga/min assist once upright.  Demonstrates boosting edge of bed x3 and spontaneously returns self to  supine, mod assist.  Transfers Overall transfer level: Needs assistance Equipment used: Rolling walker (2 wheeled) Transfers: Sit to/from Stand Sit to Stand: Min assist;Mod assist         General transfer comment: pt comes to stand relatively easy, does attempt to take steps, but unsafe with command following and OT guides pt back to bed with MAX A and MAX cues to negotiate and maneuver RW.    Balance Overall balance assessment: Needs assistance Sitting-balance support: No upper extremity supported;Feet supported Sitting balance-Leahy Scale: Fair   Postural control: Posterior lean   Standing balance-Leahy Scale: Poor Standing balance comment: RW and MIN/MOD A for static standing balance                           ADL either performed or assessed with clinical judgement   ADL                                         General ADL Comments: Pt requires MOD A with seated UB grooming, TOTAL A for LB and toileting ADLs at bed level. Pt requires MIN/MOD A for ADL transfers (sit<>stand) from EOB with RW. Standing balance is poor with RW requiring MIN/MOD A consistently for safety. Stands to complete one grooming task with MOD A. OT has to manuever RW for pt to complete small shuffling steps back to bed side to sit. Pt with ver poor command following for fxl mobility.     Vision   Additional  Comments: unsure of PLOF, pt somewhat tracks appropriately when she does attend, poor visual attention makes assessment of vision difficulty. Pt with eye closing throughout.     Perception     Praxis      Pertinent Vitals/Pain Pain Assessment: Faces Faces Pain Scale: Hurts little more Pain Location: R hip Pain Descriptors / Indicators: Aching;Guarding;Grimacing Pain Intervention(s): Limited activity within patient's tolerance;Monitored during session;Repositioned     Hand Dominance     Extremity/Trunk Assessment Upper Extremity Assessment Upper Extremity  Assessment: Generalized weakness(appears to move through arc of motion for some ADL tasks, unable to formally assess d/t poor command following/sequencing)   Lower Extremity Assessment Lower Extremity Assessment: Defer to PT evaluation;Generalized weakness       Communication Communication Communication: Other (comment)(difficult to make out many of pt's words, large portion of her speech is unintelligible.)   Cognition Arousal/Alertness: Lethargic Behavior During Therapy: Flat affect Overall Cognitive Status: No family/caregiver present to determine baseline cognitive functioning                                 General Comments: unable to state name or answer any orientation questions; Follows one step commands inconsistently once in upright sitting, visual attention somewhat improves when pt is upright. Primarily unable to follow commands   General Comments       Exercises Other Exercises Other Exercises: OT provides MAX cueing for safety for fxl tasks that pt is able to complete on assessment. Pt with very poor reception of safety cueing and overall difficulty with command following.   Shoulder Instructions      Home Living Family/patient expects to be discharged to:: Unsure Living Arrangements: Alone                               Additional Comments: Patient poor historian; unable to provide accurate history.  Will verify with family as available. Attempted to call son [name]) via phone with no answer.      Prior Functioning/Environment          Comments: Patient poor historian; unable to provide accurate history.  Will verify with family as available.  Per chart, indep ambulatory at baseline; does endorse at least 3 falls in previous week. Pt apparently lives alone Per RN and was found down 2 x in a row by son who brought her to acute hospital.        OT Problem List: Decreased strength;Decreased range of motion;Decreased activity  tolerance;Impaired balance (sitting and/or standing);Decreased cognition;Decreased safety awareness;Decreased knowledge of use of DME or AE;Decreased knowledge of precautions;Pain      OT Treatment/Interventions: Self-care/ADL training;Therapeutic exercise;DME and/or AE instruction;Patient/family education;Therapeutic activities;Balance training    OT Goals(Current goals can be found in the care plan section) Acute Rehab OT Goals OT Goal Formulation: Patient unable to participate in goal setting  OT Frequency: Min 1X/week   Barriers to D/C:            Co-evaluation              AM-PAC OT "6 Clicks" Daily Activity     Outcome Measure Help from another person eating meals?: A Little Help from another person taking care of personal grooming?: A Little Help from another person toileting, which includes using toliet, bedpan, or urinal?: Total Help from another person bathing (including washing, rinsing, drying)?: Total Help from another person  to put on and taking off regular upper body clothing?: A Lot Help from another person to put on and taking off regular lower body clothing?: Total 6 Click Score: 11   End of Session Equipment Utilized During Treatment: Gait belt;Rolling walker Nurse Communication: Mobility status  Activity Tolerance: Patient tolerated treatment well Patient left: in bed;with call bell/phone within reach(with physician and PT presenting to assess pt.)  OT Visit Diagnosis: Unsteadiness on feet (R26.81);Muscle weakness (generalized) (M62.81);Other symptoms and signs involving cognitive function                Time: 3086-5784 OT Time Calculation (min): 24 min Charges:  OT General Charges $OT Visit: 1 Visit OT Evaluation $OT Eval Moderate Complexity: 1 Mod OT Treatments $Self Care/Home Management : 8-22 mins  Rejeana Brock, MS, OTR/L ascom [phone] 07/30/19, 2:50 PM

## 2019-07-30 NOTE — Evaluation (Signed)
Physical Therapy Evaluation Patient Details Name: Cindy James MRN: [id number] DOB: 03-24-1927 Today's Date: 07/30/2019   History of Present Illness  presented to ER secondary to fall with R hip pain (and associated ecchymosis); admitted for management of rhabdomyolysis.  R hip imaging negative.  Clinical Impression  Patient supine in bed, OT at bedside upon arrival to session.  Noted to be mid-bed/linen change due to bladder incontinence.  Patient globally confused with limited ability to participate actively with session.  Hand-over-hand assist to initiate and complete movement for all tasks.  Tolerates passive UE/LE ROM throughout all extremities, but does demonstrate mild grimacing with movement of R hip.  Currently requiring total/dep assist +2 for bed mobility; mod/max assist for supine/sit and cga/min assist for unsupported sitting balance edge of bed.  Patient spontaneously returning to supine; resisting attempts at additional mobility.  Will continue assessment/progression as appropriate. Would benefit from skilled PT to address above deficits and promote optimal return to PLOF.; recommend transition to STR upon discharge from acute hospitalization, pending ability to actively participate and progress.     Follow Up Recommendations SNF(pending ability to participate/progress)    Equipment Recommendations       Recommendations for Other Services       Precautions / Restrictions Precautions Precautions: Fall Restrictions Weight Bearing Restrictions: No      Mobility  Bed Mobility Overal bed mobility: Needs Assistance Bed Mobility: Rolling;Supine to Sit;Sit to Supine Rolling: Total assist;+2 for physical assistance   Supine to sit: Max assist Sit to supine: Mod assist   General bed mobility comments: hand-over-hand to initiate movement of LEs towards edge of bed and truncal elevation; does maintain static sitting with cga/min assist once upright.  Demonstrates boosting edge  of bed x3 and spontaneously returns self to supine, mod assist.  Transfers                 General transfer comment: unsafe/unable; patient refused  Ambulation/Gait             General Gait Details: grossly 3-/5 throughout, hand-over-hand required for movement  Stairs            Wheelchair Mobility    Modified Rankin (Stroke Patients Only)       Balance Overall balance assessment: Needs assistance Sitting-balance support: No upper extremity supported;Feet supported Sitting balance-Leahy Scale: Fair                                       Pertinent Vitals/Pain Pain Assessment: Faces Faces Pain Scale: Hurts little more Pain Location: R hip Pain Descriptors / Indicators: Aching;Guarding;Grimacing Pain Intervention(s): Limited activity within patient's tolerance;Monitored during session;Repositioned    Home Living Family/patient expects to be discharged to:: Unsure                 Additional Comments: Patient poor historian; unable to provide accurate history.  Will verify with family as available.    Prior Function           Comments: Patient poor historian; unable to provide accurate history.  Will verify with family as available.  Per chart, indep ambulatory at baseline; does endorse at least 3 falls in previous week.     Hand Dominance        Extremity/Trunk Assessment   Upper Extremity Assessment Upper Extremity Assessment: Generalized weakness(grossly 3-/5 throughout, hand-over-hand required for movement)    Lower Extremity Assessment Lower  Extremity Assessment: Generalized weakness(grossly 3-/5 throughout, hand-over-hand required for movement throughout range.  Does spontaneously move back into bed (at least 4-/5))       Communication   Communication: (speech largely unintelligible and non-sensical)  Cognition Arousal/Alertness: Lethargic Behavior During Therapy: Flat affect Overall Cognitive Status: No  family/caregiver present to determine baseline cognitive functioning                                 General Comments: unable to state name or answer any orientation questions; unable to follow simple commands.      General Comments      Exercises  Rolling bilat, dep/total assist +2; dep assist for hygiene, linen and clothing change.   Assessment/Plan    PT Assessment Patient needs continued PT services  PT Problem List Decreased strength;Decreased range of motion;Decreased activity tolerance;Decreased balance;Decreased mobility;Decreased coordination;Decreased cognition;Decreased knowledge of use of DME;Decreased safety awareness;Decreased knowledge of precautions;Decreased skin integrity;Pain       PT Treatment Interventions DME instruction;Gait training;Functional mobility training;Therapeutic activities;Therapeutic exercise;Balance training;Patient/family education    PT Goals (Current goals can be found in the Care Plan section)  Acute Rehab PT Goals PT Goal Formulation: Patient unable to participate in goal setting Time For Goal Achievement: 08/13/19 Potential to Achieve Goals: Fair    Frequency Min 2X/week   Barriers to discharge Decreased caregiver support      Co-evaluation               AM-PAC PT "6 Clicks" Mobility  Outcome Measure Help needed turning from your back to your side while in a flat bed without using bedrails?: Total Help needed moving from lying on your back to sitting on the side of a flat bed without using bedrails?: Total Help needed moving to and from a bed to a chair (including a wheelchair)?: Total Help needed standing up from a chair using your arms (e.g., wheelchair or bedside chair)?: Total Help needed to walk in hospital room?: Total Help needed climbing 3-5 steps with a railing? : Total 6 Click Score: 6    End of Session   Activity Tolerance: (limited by cognitive ability to actively participate) Patient left: in  bed;with call bell/phone within reach;with bed alarm set Nurse Communication: Mobility status PT Visit Diagnosis: Difficulty in walking, not elsewhere classified (R26.2);Muscle weakness (generalized) (M62.81)    Time: 2952-8413 PT Time Calculation (min) (ACUTE ONLY): 19 min   Charges:   PT Evaluation $PT Eval Moderate Complexity: 1 Mod        Londyn Wotton H. Owens Shark, PT, DPT, NCS 07/30/19, 1:49 PM [phone]

## 2019-07-30 NOTE — Progress Notes (Signed)
Pt arrived from ED alert but disoriented x4. Pt is very confused and is trying to pull off everything. Pt is pleasant and not combative, just fidgety. NP made aware, 0.5 mg Ativan ordered with no relief. Will continue to monior.

## 2019-07-30 NOTE — Progress Notes (Signed)
Initial Nutrition Assessment  DOCUMENTATION CODES:   Not applicable  INTERVENTION:   Ensure Enlive po BID, each supplement provides 350 kcal and 20 grams of protein  Magic cup TID with meals, each supplement provides 290 kcal and 9 grams of protein  NUTRITION DIAGNOSIS:   Inadequate oral intake related to acute illness as evidenced by meal completion < 50%.  GOAL:   Patient will meet greater than or equal to 90% of their needs  MONITOR:   PO intake, Supplement acceptance, Labs, Weight trends, Skin, I & O's  REASON FOR ASSESSMENT:   Consult Assessment of nutrition requirement/status  ASSESSMENT:   86 y.o. female with a past medical history of mild dementia, hypertension, hyperlipidemia presents to the emergency department after a fall with right hip pain and confusion.  RD working remotely.  Unable to speak with pt r/t dementia and confusion. Pt seen by SLP today and placed on a dysphagia 1/thin liquid diet. RD will add supplements to help pt meet her estimated needs. There is no recent weight history to determine if patient has had any significant weight changes but pt does appear weight stable from her last documented weight of 153lbs in 2019. Palliative care consult pending.   Medications reviewed and include:   Labs reviewed: K 3.4(L), P 2.3(L), Mg 1.8 wnl  Unable to complete Nutrition-Focused physical exam at this time.   Diet Order:   Diet Order            DIET - DYS 1 Room service appropriate? Yes with Assist; Fluid consistency: Thin  Diet effective now             EDUCATION NEEDS:   Not appropriate for education at this time  Skin:  Skin Assessment: Reviewed RN Assessment(ecchymosis)  Last BM:  pta  Height:   Ht Readings from Last 1 Encounters:  07/29/19 5\' 2"  (1.575 m)    Weight:   Wt Readings from Last 1 Encounters:  07/29/19 68.5 kg    Ideal Body Weight:  50 kg  BMI:  Body mass index is 27.62 kg/m.  Estimated Nutritional Needs:    Kcal:  1500-1700kcal/day  Protein:  75-85g/day  Fluid:  >1.3L/day  09/26/19 MS, RD, LDN Contact information available in Amion

## 2019-07-30 NOTE — Progress Notes (Signed)
Patients son, Cindy James is patients healthcare POA. POA papers copied and placed in chart.

## 2019-07-30 NOTE — Evaluation (Signed)
Clinical/Bedside Swallow Evaluation Patient Details  Name: Cindy James MRN: [id number] Date of Birth: 01/14/1927  Today's Date: 07/30/2019 Time: SLP Start Time (ACUTE ONLY): 49 SLP Stop Time (ACUTE ONLY): 1135 SLP Time Calculation (min) (ACUTE ONLY): 55 min  Past Medical History:  Past Medical History:  Diagnosis Date  . Dementia ([place])   . High cholesterol   . Hypertension    Past Surgical History: History reviewed. No pertinent surgical history. HPI:  Pt is a 85 y.o. female with medical history significant of Dementia, CAD, carotid artery disease, HLD, GERD, HTN, B12.  Presented to ED with Fall since yesterday now with bruise of the right thigh; has been increasing demented and have been hallucinating lately. She has been reaching out to things that are not there.    Assessment / Plan / Recommendation Clinical Impression  Pt appears to present w/ grossly adequate oropharyngeal swallowing function w/ a modified diet/foods(puree); thin liquids. Pt presents w/ significant Cognitive decline; baseline Dementia. Eyes were closed often; pt picking at IV and needed her Mitt. Oral care attempted but pt was often confused w/ task. Pt required full assistance to sit midline w/ head forward. Pt exhibited decreased attention to the oral acceptance and prep stage -- SLP often gave verbal/tactile cues w/ spoon/straw at lips to engage pt. Once engaged, pt accepted po trials consuming thin liquids and purees w/ full support by SLP for feeding. She tolerated sips of liquids via Straw w/ light stim at lips. She consumed trials w/ No overt clinical s/s of aspiration; no decline in vocal quality or respiratory effort noted during/post trials. Oral phase c/b Confusion during bolus acceptance and prep, however, once in the mouth, bolus management and A-P transfer for swallowing appeared grossly WFL. Oral clearing achieved b/t trials. No gross oral holding/delay in A-P transfer of bolus trials noted, though  Cognitive decline can impact such. Pt stated po's were "good". SLP supported oral clearing by alternating foods/liquids. OM exam was limited d/t pt's Cognitive status and follow through. Pt required Full feeding support. Recommend a Dysphagia level 1 (puree) diet w/ Thin liquids w/ aspiration precautions; feeding support and Pills Crushed in puree for safer swallowing. As pt's Cognitive confusion and fidgitiness/agitation calm, trials of increased texture to upgrade diet can be explored. This diet consistency would certainly support pt being able to meet nutrition needs more easily d/t the nature of the consistency and ease of intake w/ her Cognitive decline. Recommend Dietician f/u for drink supplement support.  SLP Visit Diagnosis: Dysphagia, oral phase (R13.11)(Cognitive decline)    Aspiration Risk  Mild aspiration risk;Risk for inadequate nutrition/hydration(reduced following precautions)    Diet Recommendation  Dysphagia level 1 (puree w/ gravies added); Thin liquids. General aspiration precautions; full feeding support at meals -- reduce distractions at meals  Medication Administration: Crushed with puree(for safer swallowing)    Other  Recommendations Recommended Consults: (Dietician f/u for support) Oral Care Recommendations: Oral care BID;Oral care before and after PO;Staff/trained caregiver to provide oral care Other Recommendations: (n/a)   Follow up Recommendations Skilled Nursing facility      Frequency and Duration (n/a)  (n/a)       Prognosis Prognosis for Safe Diet Advancement: Fair Barriers to Reach Goals: Cognitive deficits;Time post onset;Severity of deficits;Behavior      Swallow Study   General Date of Onset: 07/29/19 HPI: Pt is a 86 y.o. female with medical history significant of Dementia, CAD, carotid artery disease, HLD, GERD, HTN, B12.  Presented to ED with Fall  since yesterday now with bruise of the right thigh; has been increasing demented and have been  hallucinating lately. She has been reaching out to things that are not there.  Type of Study: Bedside Swallow Evaluation Previous Swallow Assessment: none noted Diet Prior to this Study: Regular;Thin liquids Temperature Spikes Noted: No(wbc 6.3) Respiratory Status: Room air History of Recent Intubation: No Behavior/Cognition: Cooperative;Pleasant mood;Confused;Agitated;Distractible;Requires cueing;Doesn't follow directions(Awake) Oral Cavity Assessment: Within Functional Limits;Dry(slight) Oral Care Completed by SLP: Yes(attempted) Oral Cavity - Dentition: Adequate natural dentition Vision: (n/a) Self-Feeding Abilities: Total assist Patient Positioning: Upright in bed(needed full positioning) Baseline Vocal Quality: Normal Volitional Cough: Cognitively unable to elicit Volitional Swallow: Unable to elicit    Oral/Motor/Sensory Function Overall Oral Motor/Sensory Function: Within functional limits(w/ bolus management and oral clearing; speech clear)   Ice Chips Ice chips: Within functional limits Presentation: Spoon(fed; 2 trials) Other Comments: decreased awareness   Thin Liquid Thin Liquid: Within functional limits Presentation: Straw(supported w/ pt helping to hold; 10+ trials) Other Comments: decreased awareness; water, juice, Ensure/Boost    Nectar Thick Nectar Thick Liquid: Not tested   Honey Thick Honey Thick Liquid: Not tested   Puree Puree: Within functional limits Presentation: Spoon(fed; 8+ trials) Other Comments: decreased awareness; min increased difficulty accepting boluses from spoon d/t confusion   Solid     Solid: Not tested       Jerilynn Som, MS, CCC-SLP Ceonna Frazzini 07/30/2019,12:02 PM

## 2019-07-30 NOTE — Progress Notes (Signed)
New referral for [facility] hospice home received from hospitalist Dr. Manuella Ghazi. Writer met in the room with patient's son Cindy James to initiate education regarding hospice services, philosophy, team approach to care and current visitation policy with understanding voiced. Patient's information has been sent to referral and pending review/approval by the hospice medical director. Both Cindy James and hospital care team are aware that [facility] is unable to offer a bed today. If a bed does open up over the weekend the hospice home staff with contact the weekend TOC. Thank you for the opportunity to be involved in the care of this patient and her family. Flo Shanks BSN, RN, [place] [phone]

## 2019-07-31 DIAGNOSIS — Z515 Encounter for palliative care: Secondary | ICD-10-CM

## 2019-07-31 NOTE — Progress Notes (Signed)
PROGRESS NOTE    Cindy James  ZHY:[id number] DOB: [birth year] DOA: 07/29/2019 PCP: Kirk Ruths, MD   Brief Narrative:  Cindy James is a 86 y.o. female with medical history significant of dementia, CAD, carotid artery disease, HLD, GERD, HTN, B12    Presented with  Fall since yesterday now with bruise of the right thigh, she has dementia at baseline she has been very unsteady with ambulation lately. Fall likely occurred this morning she was found around 5:45 AM last was seen at 8 PM when patient was trying to dress up in the dark. She have had 3 falls this week. Fr the past few weeks she has been hurting everywhere she has hard time getting up. Have been incontinent and have been tremulous this morning. She at baseline walks independently Patient has been reporting multiple area of tenderness likely secondary to repeated falls She has been increasing demented and have been hallucinating lately. She has been reaching out to things that are not there. Family gave her tylenol with no improvement.  Family have not had any exposure  Family report patient have gotten her 2n Levan Hurst shot 2 days ago.  He family have had their second shot and have tolerated it well.   2/6: Patient seen and examined.  Patient's son is sitting at bedside.  On conversation appointment patient functional decline over the past several weeks to months.  Discussed with case management.  Currently pending admission to hospice home care center.  Pending bed availability.   Assessment & Plan:   Active Problems:   Dementia with behavioral disturbance (HCC)   Rhabdomyolysis   CAD (coronary artery disease)   Falls   Hip pain  Recurrent falls Rhabdomyolysis Dementia with behavioral disturbances Current plan of care is to transition to comfort care.  Patient is currently on the waiting list for hospice home.  TOC team aware and is currently following up on bed availability.  Last notification from social  worker today states that currently no beds are available and the patient is started on the waiting list.  Current inpatient orders focused on patient comfort.  All medications not centered on patient comfort will be discontinued.  DVT prophylaxis:, Comfort care Code Status: DNR Family Communication: Son at bedside Disposition Plan: [place], anticipate 24 hours, pending bed availability  Consultants:   None  Procedures:   None  Antimicrobials:   None   Subjective: Patient seen, exam deferred Patient resting comfortably, no visible distress Patient's son is present at bedside   Objective: Vitals:   07/31/19 0601 07/31/19 0603 07/31/19 0739 07/31/19 1123  BP: (!) 151/55 (!) 151/55 (!) 172/89 (!) 156/66  Pulse: 66 67 94 83  Resp: 19 19 17 18   Temp: 97.8 F (36.6 C) 97.8 F (36.6 C) (!) 97.5 F (36.4 C) 97.9 F (36.6 C)  TempSrc: Oral Oral Oral Oral  SpO2: 98% 99% 95% 93%  Weight:      Height:       No intake or output data in the 24 hours ending 07/31/19 1412 Filed Weights   07/29/19 1138  Weight: 68.5 kg    Examination:  Exam deferred given comfort measures status    Data Reviewed: I have personally reviewed following labs and imaging studies  CBC: Recent Labs  Lab 07/29/19 1158 07/30/19 0513  WBC 6.4 6.3  HGB 13.3 11.6*  HCT 40.1 34.9*  MCV 94.4 94.8  PLT 225 295   Basic Metabolic Panel: Recent Labs  Lab 07/29/19  1158 07/29/19 1847 07/30/19 0513  NA 134*  --  138  K 3.6  --  3.4*  CL 100  --  107  CO2 22  --  22  GLUCOSE 109*  --  98  BUN 19  --  16  CREATININE 0.82  --  0.72  CALCIUM 9.5  --  8.6*  MG  --  1.6* 1.8  PHOS  --  2.6 2.3*   GFR: Estimated Creatinine Clearance: 42.4 mL/min (by C-G formula based on SCr of 0.72 mg/dL). Liver Function Tests: Recent Labs  Lab 07/29/19 1847 07/30/19 0513  AST 42* 36  ALT 26 22  ALKPHOS 68 61  BILITOT 1.8* 1.7*  PROT 6.6 6.1*  ALBUMIN 3.4* 3.1*   No results for input(s):  LIPASE, AMYLASE in the last 168 hours. No results for input(s): AMMONIA in the last 168 hours. Coagulation Profile: No results for input(s): INR, PROTIME in the last 168 hours. Cardiac Enzymes: Recent Labs  Lab 07/29/19 1158 07/29/19 1847 07/30/19 0513  CKTOTAL 1,048* 1,075* 996*   BNP (last 3 results) No results for input(s): PROBNP in the last 8760 hours. HbA1C: No results for input(s): HGBA1C in the last 72 hours. CBG: No results for input(s): GLUCAP in the last 168 hours. Lipid Profile: No results for input(s): CHOL, HDL, LDLCALC, TRIG, CHOLHDL, LDLDIRECT in the last 72 hours. Thyroid Function Tests: Recent Labs    07/30/19 0513  TSH 3.094   Anemia Panel: Recent Labs    07/30/19 0513  VITAMINB12 725   Sepsis Labs: No results for input(s): PROCALCITON, LATICACIDVEN in the last 168 hours.  Recent Results (from the past 240 hour(s))  Respiratory Panel by RT PCR (Flu A&B, Covid) - Nasopharyngeal Swab     Status: None   Collection Time: 07/29/19  6:47 PM   Specimen: Nasopharyngeal Swab  Result Value Ref Range Status   SARS Coronavirus 2 by RT PCR NEGATIVE NEGATIVE Final    Comment: (NOTE) SARS-CoV-2 target nucleic acids are NOT DETECTED. The SARS-CoV-2 RNA is generally detectable in upper respiratoy specimens during the acute phase of infection. The lowest concentration of SARS-CoV-2 viral copies this assay can detect is 131 copies/mL. A negative result does not preclude SARS-Cov-2 infection and should not be used as the sole basis for treatment or other patient management decisions. A negative result may occur with  improper specimen collection/handling, submission of specimen other than nasopharyngeal swab, presence of viral mutation(s) within the areas targeted by this assay, and inadequate number of viral copies (<131 copies/mL). A negative result must be combined with clinical observations, patient history, and epidemiological information. The expected result  is Negative. Fact Sheet for Patients:  https://www.moore.com/ Fact Sheet for Healthcare Providers:  https://www.young.biz/ This test is not yet ap proved or cleared by the [place] FDA and  has been authorized for detection and/or diagnosis of SARS-CoV-2 by FDA under an Emergency Use Authorization (EUA). This EUA will remain  in effect (meaning this test can be used) for the duration of the COVID-19 declaration under Section 564(b)(1) of the Act, 21 U.S.C. section 360bbb-3(b)(1), unless the authorization is terminated or revoked sooner.    Influenza A by PCR NEGATIVE NEGATIVE Final   Influenza B by PCR NEGATIVE NEGATIVE Final    Comment: (NOTE) The Xpert Xpress SARS-CoV-2/FLU/RSV assay is intended as an aid in  the diagnosis of influenza from Nasopharyngeal swab specimens and  should not be used as a sole basis for treatment. Nasal washings and  aspirates are unacceptable for Xpert Xpress SARS-CoV-2/FLU/RSV  testing. Fact Sheet for Patients: https://www.moore.com/ Fact Sheet for Healthcare Providers: https://www.young.biz/ This test is not yet approved or cleared by the [place] FDA and  has been authorized for detection and/or diagnosis of SARS-CoV-2 by  FDA under an Emergency Use Authorization (EUA). This EUA will remain  in effect (meaning this test can be used) for the duration of the  Covid-19 declaration under Section 564(b)(1) of the Act, 21  U.S.C. section 360bbb-3(b)(1), unless the authorization is  terminated or revoked. Performed at [facility], [address] [place], [place] [address]          Radiology Studies: DG Chest 1 View  Result Date: 07/29/2019 CLINICAL DATA:  Weakness EXAM: CHEST  1 VIEW COMPARISON:  None. FINDINGS: Cardiac enlargement. Negative for heart failure. Extensive atherosclerotic calcification ascending aorta compatible with ascending aortic  aneurysm. Calcified AP window lymph nodes. Linear densities in the left lung base may represent atelectasis or scarring. No focal pneumonia or effusion. IMPRESSION: Ascending aortic aneurysm.  Negative for heart failure Left lower lobe atelectasis/scarring. Electronically Signed   By: Marlan Palau M.D.   On: 07/29/2019 19:37   CT Hip Right Wo Contrast  Result Date: 07/29/2019 CLINICAL DATA:  Acute hip pain EXAM: CT OF THE RIGHT HIP WITHOUT CONTRAST TECHNIQUE: Multidetector CT imaging of the right hip was performed according to the standard protocol. Multiplanar CT image reconstructions were also generated. COMPARISON:  None. FINDINGS: Bones/Joint/Cartilage No fracture or dislocation. Moderate right hip osteoarthritis is seen with superior joint space loss and marginal osteophyte formation. There is diffuse osteopenia. There is sclerosis around the sacroiliac joint and anterior pubic symphysis. No large hip joint effusion is seen. Ligaments Suboptimally assessed by CT. Muscles and Tendons Mild fatty atrophy noted within the muscles surrounding the hip. The tendons appear to be grossly intact. Soft tissues No focal soft tissue swelling is seen. There are dense vascular calcifications. The visualized portion of the deep pelvis is grossly unremarkable. IMPRESSION: No acute osseous abnormality.  Moderate right hip osteoarthritis. Electronically Signed   By: Jonna Clark M.D.   On: 07/29/2019 17:56   DG HIP UNILAT W OR W/O PELVIS 2-3 VIEWS RIGHT  Result Date: 07/29/2019 CLINICAL DATA:  86 year old female with fall and right hip pain. EXAM: DG HIP (WITH OR WITHOUT PELVIS) 2-3V RIGHT COMPARISON:  CT of the abdomen pelvis dated 10/27/2017. FINDINGS: There is no acute fracture or dislocation. The bones are osteopenic. There is arthritic changes of the right hip with severe joint space narrowing with near bone-on-bone contact. There is degenerative changes of the visualized lower lumbar spine with scoliosis. The soft  tissues are unremarkable. IMPRESSION: 1. No acute fracture or dislocation. 2. Degenerative changes of the right hip with severe narrowing of the joint space. Electronically Signed   By: Elgie Collard M.D.   On: 07/29/2019 15:57        Scheduled Meds: . feeding supplement (ENSURE ENLIVE)  237 mL Oral BID BM  . mouth rinse  15 mL Mouth Rinse BID  . morphine CONCENTRATE  5 mg Oral Q6H   Continuous Infusions:   LOS: 1 day    Time spent: 25 minutes    Tresa Moore, MD [facility] Pager [phone]  If 7PM-7AM, please contact night-coverage 07/31/2019, 2:12 PM

## 2019-07-31 NOTE — TOC Progression Note (Addendum)
Transition of Care [facility]) - Progression Note    Patient Details  Name: Cindy James MRN: [id number] Date of Birth: 1926-11-14  [facility] CM/SW Contact  Dominic Pe[place], [place] Phone Number: 07/31/2019, 10:38 AM  Clinical Narrative:    Spoke with Eunice Blase [phone]) at [facility] to check on bed availability. No beds available today, per Debbie. Confirmed patient is on wait list and 3 people currently ahead of patient on waiting list. Dr. Georgeann Oppenheim and RN updated. TOC continuing to follow for discharge needs.       Expected Discharge Plan and Services  Barriers to discharge: No hospice bed available.                                                Social Determinants of Health (SDOH) Interventions    Readmission Risk Interventions No flowsheet data found.

## 2019-08-01 MED ORDER — DORZOLAMIDE HCL-TIMOLOL MAL 2-0.5 % OP SOLN
1.0000 [drp] | Freq: Two times a day (BID) | OPHTHALMIC | Status: DC
  Administered 2019-08-01 – 2019-08-03 (×4): 1 [drp] via OPHTHALMIC
  Filled 2019-08-01: qty 10

## 2019-08-01 MED ORDER — HALOPERIDOL 0.5 MG PO TABS
0.5000 mg | ORAL_TABLET | ORAL | Status: DC | PRN
  Filled 2019-08-01: qty 1

## 2019-08-01 MED ORDER — MORPHINE SULFATE (PF) 2 MG/ML IV SOLN
1.0000 mg | INTRAVENOUS | Status: DC | PRN
  Administered 2019-08-01: 1 mg via INTRAVENOUS
  Filled 2019-08-01: qty 1

## 2019-08-01 MED ORDER — GLYCOPYRROLATE 1 MG PO TABS
1.0000 mg | ORAL_TABLET | ORAL | Status: DC | PRN
  Filled 2019-08-01: qty 1

## 2019-08-01 MED ORDER — CARVEDILOL 3.125 MG PO TABS
3.1250 mg | ORAL_TABLET | Freq: Two times a day (BID) | ORAL | Status: DC
  Administered 2019-08-01 – 2019-08-03 (×4): 3.125 mg via ORAL
  Filled 2019-08-01 (×4): qty 1

## 2019-08-01 MED ORDER — LORAZEPAM 2 MG/ML IJ SOLN: 1.0000 mg | INTRAMUSCULAR | Status: DC | PRN

## 2019-08-01 MED ORDER — LORAZEPAM 1 MG PO TABS: 1.0000 mg | ORAL_TABLET | ORAL | Status: DC | PRN

## 2019-08-01 MED ORDER — GLYCOPYRROLATE 0.2 MG/ML IJ SOLN
0.2000 mg | INTRAMUSCULAR | Status: DC | PRN
  Filled 2019-08-01: qty 1

## 2019-08-01 MED ORDER — LORAZEPAM 2 MG/ML PO CONC
1.0000 mg | ORAL | Status: DC | PRN
  Filled 2019-08-01: qty 0.5

## 2019-08-01 MED ORDER — LATANOPROST 0.005 % OP SOLN
1.0000 [drp] | Freq: Every day | OPHTHALMIC | Status: DC
  Administered 2019-08-01 – 2019-08-02 (×2): 1 [drp] via OPHTHALMIC
  Filled 2019-08-01: qty 2.5

## 2019-08-01 MED ORDER — ONDANSETRON HCL 4 MG/2ML IJ SOLN: 4.0000 mg | Freq: Four times a day (QID) | INTRAMUSCULAR | Status: DC | PRN

## 2019-08-01 MED ORDER — HALOPERIDOL LACTATE 5 MG/ML IJ SOLN: 0.5000 mg | INTRAMUSCULAR | Status: DC | PRN

## 2019-08-01 MED ORDER — ONDANSETRON 4 MG PO TBDP
4.0000 mg | ORAL_TABLET | Freq: Four times a day (QID) | ORAL | Status: DC | PRN
  Filled 2019-08-01: qty 1

## 2019-08-01 MED ORDER — HALOPERIDOL LACTATE 2 MG/ML PO CONC
0.5000 mg | ORAL | Status: DC | PRN
  Filled 2019-08-01: qty 0.3

## 2019-08-01 NOTE — Progress Notes (Signed)
PROGRESS NOTE    Cindy James  PPI:[id number] DOB: 1927-03-04 DOA: 07/29/2019 PCP: Lauro Regulus, MD   Brief Narrative:  Cindy James is a 86 y.o. female with medical history significant of dementia, CAD, carotid artery disease, HLD, GERD, HTN, B12    Presented with  Fall since yesterday now with bruise of the right thigh, she has dementia at baseline she has been very unsteady with ambulation lately. Fall likely occurred this morning she was found around 5:45 AM last was seen at 8 PM when patient was trying to dress up in the dark. She have had 3 falls this week. Fr the past few weeks she has been hurting everywhere she has hard time getting up. Have been incontinent and have been tremulous this morning. She at baseline walks independently Patient has been reporting multiple area of tenderness likely secondary to repeated falls She has been increasing demented and have been hallucinating lately. She has been reaching out to things that are not there. Family gave her tylenol with no improvement.  Family have not had any exposure  Family report patient have gotten her 2n Gala Murdoch shot 2 days ago.  He family have had their second shot and have tolerated it well.   2/6: Patient seen, exam deferred.  Patient's son is sitting at bedside.  On conversation appointment patient functional decline over the past several weeks to months.  Discussed with case management.  Currently pending admission to hospice home care center.  Pending bed availability.  2/7: Patient seen, exam deferred.  Patient son at bedside.  Pending bed availability at hospice facility.  Patient does not appear to be in any distress.   Assessment & Plan:   Active Problems:   Dementia with behavioral disturbance (HCC)   Rhabdomyolysis   CAD (coronary artery disease)   Falls   Hip pain  Recurrent falls Rhabdomyolysis Dementia with behavioral disturbances Current plan of care is to transition to comfort care.   Patient is currently on the waiting list for hospice home.  TOC team aware and is currently following up on bed availability.  Last notification from social worker today states that currently no beds are available and the patient is started on the waiting list.  Current inpatient orders focused on patient comfort.  All medications not centered on patient comfort will be discontinued.  DVT prophylaxis:, Comfort care Code Status: DNR Family Communication: Son at bedside Disposition Plan: Hospice Home, anticipate 24 hours, pending bed availability.  Discussed with TOC team  Consultants:   None  Procedures:   None  Antimicrobials:   None   Subjective: Patient seen, exam deferred Patient resting comfortably, no visible distress Patient's son is present at bedside   Objective: Vitals:   07/31/19 1923 08/01/19 0540 08/01/19 0759 08/01/19 1313  BP: (!) 161/77 (!) 171/82 (!) 153/62 (!) 126/94  Pulse: 98 93 87 100  Resp: 18 18 16 16   Temp: 99.1 F (37.3 C) 98.5 F (36.9 C) (!) 97.5 F (36.4 C) 98.5 F (36.9 C)  TempSrc: Oral Oral Oral Oral  SpO2: 95% 96% 97% 94%  Weight:      Height:        Intake/Output Summary (Last 24 hours) at 08/01/2019 1429 Last data filed at 07/31/2019 1815 Gross per 24 hour  Intake --  Output 0 ml  Net 0 ml   Filed Weights   07/29/19 1138  Weight: 68.5 kg    Examination:  Exam deferred given comfort measures status  Data Reviewed: I have personally reviewed following labs and imaging studies  CBC: Recent Labs  Lab 07/29/19 1158 07/30/19 0513  WBC 6.4 6.3  HGB 13.3 11.6*  HCT 40.1 34.9*  MCV 94.4 94.8  PLT 225 221   Basic Metabolic Panel: Recent Labs  Lab 07/29/19 1158 07/29/19 1847 07/30/19 0513  NA 134*  --  138  K 3.6  --  3.4*  CL 100  --  107  CO2 22  --  22  GLUCOSE 109*  --  98  BUN 19  --  16  CREATININE 0.82  --  0.72  CALCIUM 9.5  --  8.6*  MG  --  1.6* 1.8  PHOS  --  2.6 2.3*   GFR: Estimated Creatinine  Clearance: 42.4 mL/min (by C-G formula based on SCr of 0.72 mg/dL). Liver Function Tests: Recent Labs  Lab 07/29/19 1847 07/30/19 0513  AST 42* 36  ALT 26 22  ALKPHOS 68 61  BILITOT 1.8* 1.7*  PROT 6.6 6.1*  ALBUMIN 3.4* 3.1*   No results for input(s): LIPASE, AMYLASE in the last 168 hours. No results for input(s): AMMONIA in the last 168 hours. Coagulation Profile: No results for input(s): INR, PROTIME in the last 168 hours. Cardiac Enzymes: Recent Labs  Lab 07/29/19 1158 07/29/19 1847 07/30/19 0513  CKTOTAL 1,048* 1,075* 996*   BNP (last 3 results) No results for input(s): PROBNP in the last 8760 hours. HbA1C: No results for input(s): HGBA1C in the last 72 hours. CBG: No results for input(s): GLUCAP in the last 168 hours. Lipid Profile: No results for input(s): CHOL, HDL, LDLCALC, TRIG, CHOLHDL, LDLDIRECT in the last 72 hours. Thyroid Function Tests: Recent Labs    07/30/19 0513  TSH 3.094   Anemia Panel: Recent Labs    07/30/19 0513  VITAMINB12 725   Sepsis Labs: No results for input(s): PROCALCITON, LATICACIDVEN in the last 168 hours.  Recent Results (from the past 240 hour(s))  Respiratory Panel by RT PCR (Flu A&B, Covid) - Nasopharyngeal Swab     Status: None   Collection Time: 07/29/19  6:47 PM   Specimen: Nasopharyngeal Swab  Result Value Ref Range Status   SARS Coronavirus 2 by RT PCR NEGATIVE NEGATIVE Final    Comment: (NOTE) SARS-CoV-2 target nucleic acids are NOT DETECTED. The SARS-CoV-2 RNA is generally detectable in upper respiratoy specimens during the acute phase of infection. The lowest concentration of SARS-CoV-2 viral copies this assay can detect is 131 copies/mL. A negative result does not preclude SARS-Cov-2 infection and should not be used as the sole basis for treatment or other patient management decisions. A negative result may occur with  improper specimen collection/handling, submission of specimen other than nasopharyngeal  swab, presence of viral mutation(s) within the areas targeted by this assay, and inadequate number of viral copies (<131 copies/mL). A negative result must be combined with clinical observations, patient history, and epidemiological information. The expected result is Negative. Fact Sheet for Patients:  https://www.moore.com/ Fact Sheet for Healthcare Providers:  https://www.young.biz/ This test is not yet ap proved or cleared by the [place] FDA and  has been authorized for detection and/or diagnosis of SARS-CoV-2 by FDA under an Emergency Use Authorization (EUA). This EUA will remain  in effect (meaning this test can be used) for the duration of the COVID-19 declaration under Section 564(b)(1) of the Act, 21 U.S.C. section 360bbb-3(b)(1), unless the authorization is terminated or revoked sooner.    Influenza A by PCR NEGATIVE NEGATIVE  Final   Influenza B by PCR NEGATIVE NEGATIVE Final    Comment: (NOTE) The Xpert Xpress SARS-CoV-2/FLU/RSV assay is intended as an aid in  the diagnosis of influenza from Nasopharyngeal swab specimens and  should not be used as a sole basis for treatment. Nasal washings and  aspirates are unacceptable for Xpert Xpress SARS-CoV-2/FLU/RSV  testing. Fact Sheet for Patients: PinkCheek.be Fact Sheet for Healthcare Providers: GravelBags.it This test is not yet approved or cleared by the [place] FDA and  has been authorized for detection and/or diagnosis of SARS-CoV-2 by  FDA under an Emergency Use Authorization (EUA). This EUA will remain  in effect (meaning this test can be used) for the duration of the  Covid-19 declaration under Section 564(b)(1) of the Act, 21  U.S.C. section 360bbb-3(b)(1), unless the authorization is  terminated or revoked. Performed at [facility], [address], [place], [place] [address]           Radiology Studies: No results found.      Scheduled Meds: . carvedilol  3.125 mg Oral BID  . dorzolamide-timolol  1 drop Both Eyes BID  . feeding supplement (ENSURE ENLIVE)  237 mL Oral BID BM  . latanoprost  1 drop Both Eyes QHS  . mouth rinse  15 mL Mouth Rinse BID  . morphine CONCENTRATE  5 mg Oral Q6H   Continuous Infusions:   LOS: 2 days    Time spent: 25 minutes    Sidney Ace, MD [facility] Pager [phone]  If 7PM-7AM, please contact night-coverage 08/01/2019, 2:29 PM

## 2019-08-02 DIAGNOSIS — F0391 Unspecified dementia with behavioral disturbance: Secondary | ICD-10-CM

## 2019-08-02 DIAGNOSIS — R627 Adult failure to thrive: Secondary | ICD-10-CM

## 2019-08-02 LAB — T4: T4, Total: 8.4 ug/dL (ref 4.5–12.0)

## 2019-08-02 LAB — T3: T3, Total: 100 ng/dL (ref 71–180)

## 2019-08-02 NOTE — Progress Notes (Signed)
Follow up visit made. Patient seen lying in bed, more alert and less agitated than at visit on Friday. Son Mellody Dance at beside and is encouraged by pateint's some what improved state. She is no longer requiring scheduled liquid morphine and is eating some and able to take oral medications crushed. KEith wishes now to take his mother back home to her house and will provide 24 hr care for her with the help of family. Writer provided education regarding hospice services, philosophy and team approach to care with understanding voiced. He has requested a hospital bed with delivery tomorrow morning as he has furniture to rearrange, this has been ordered. TOC Bridgett Cobb and staff RN Berenice made aware. Patient will need a signed out of facility DNR in place at discharge and will require EMS transport home. Hospice contact information given to [facility]. Will continue to follow through discharge. Dayna Barker BSN, RN, [facility] [employer] [phone]

## 2019-08-02 NOTE — Progress Notes (Addendum)
Patient pulled out IV access this am during morning rounds. MD notified. Patient still having poor appetite, drank most of the ensure but nothing else.   Update 1028: MD ok with patient not having IV access at this time. PRN meds PO and patient is tolerating PO meds. Will continue to monitor.

## 2019-08-02 NOTE — TOC Progression Note (Signed)
Transition of Care [facility]) - Progression Note    Patient Details  Name: Cindy James MRN: [id number] Date of Birth: 02-22-1927  Transition of [facility] CM/SW Contact  Maree Krabbe, LCSW Phone Number: 08/02/2019, 10:53 AM  Clinical Narrative:   Clydie Braun with [facility] aware of pt. Pt awaiting a bed at [facility].          Expected Discharge Plan and Services Hospice Home    Readmission Risk Interventions No flowsheet data found.

## 2019-08-02 NOTE — Progress Notes (Signed)
PROGRESS NOTE                                                                                                                                                                                                             Patient Demographics:    Cindy James, is a 86 y.o. female, DOB - Oct 27, 1926, TDH:[id number]  Admit date - 07/29/2019   Admitting Physician Toy Baker, MD  Outpatient Primary MD for the patient is Kirk Ruths, MD  LOS - 3    Chief Complaint  Patient presents with  . Fall       Brief Narrative 86 year old female with severe dementia, progressive, coronary artery disease, hyperlipidemia, GERD, hypertension and B12 since he presented with fall with bruise over the right thigh.  Patient having frequent falls for the past 1 week.  Also was incontinent and tremulous on the morning of admission.  Patient recently received second dose of her COVID-19 vaccine.  She reportedly was having significant functional decline over the past few months. Patient found to have rhabdomyolysis and admitted for further management.      Subjective:   No overnight events.  Confused and respond to few commands.   Assessment  & Plan :   Principal problems:    Dementia with behavioral disturbance ([place]) Adult failure to thrive Progressive functional decline.  Plan on transition to comfort care.  Initially was listed and waiting for hospice home however given some improvement during the hospital stay family wished to take her home with hospice tomorrow.  She has not been requiring scheduled liquid morphine and her appetite is improving in the past 24 hours.  Family would like to take patient home with 24-hour care with hospice services on 2/9.  Active   Rhabdomyolysis, traumatic Improved with IV fluids  Hypokalemia Replenished  Medications focus for comfort only.      Code Status : DNR  Family  Communication  : Son at bedside  Disposition Plan  : Home with home hospice on 2/9 once arrangements made  Barriers For Discharge : Improving symptoms  Consults  : Hospice  Procedures  : None  DVT Prophylaxis  : Comfort  Lab Results  Component Value Date   PLT 221 07/30/2019    Antibiotics  :    Anti-infectives (From admission, onward)   None  Objective:   Vitals:   08/01/19 2233 08/02/19 0513 08/02/19 0754 08/02/19 1144  BP: (!) 156/84 (!) 150/58 (!) 130/98 (!) 136/48  Pulse: 99 70 71 74  Resp:  20 16 15   Temp: 98.9 F (37.2 C) 97.8 F (36.6 C) 98.4 F (36.9 C) 97.9 F (36.6 C)  TempSrc: Oral Oral Oral Oral  SpO2: 97% 95% 97% 96%  Weight:      Height:        Wt Readings from Last 3 Encounters:  07/29/19 68.5 kg     Intake/Output Summary (Last 24 hours) at 08/02/2019 1455 Last data filed at 08/02/2019 0936 Gross per 24 hour  Intake 130 ml  Output --  Net 130 ml     Physical Exam  Gen: Elderly female not in distress confused HEENT: Moist mucosa, supple neck Chest: Clear CVs: Normal S1-S2 GI: Soft, nondistended nontender Musculoskeletal: Warm, no edema    Data Review:    CBC Recent Labs  Lab 07/29/19 1158 07/30/19 0513  WBC 6.4 6.3  HGB 13.3 11.6*  HCT 40.1 34.9*  PLT 225 221  MCV 94.4 94.8  MCH 31.3 31.5  MCHC 33.2 33.2  RDW 12.4 12.5    Chemistries  Recent Labs  Lab 07/29/19 1158 07/29/19 1847 07/30/19 0513  NA 134*  --  138  K 3.6  --  3.4*  CL 100  --  107  CO2 22  --  22  GLUCOSE 109*  --  98  BUN 19  --  16  CREATININE 0.82  --  0.72  CALCIUM 9.5  --  8.6*  MG  --  1.6* 1.8  AST  --  42* 36  ALT  --  26 22  ALKPHOS  --  68 61  BILITOT  --  1.8* 1.7*   ------------------------------------------------------------------------------------------------------------------ No results for input(s): CHOL, HDL, LDLCALC, TRIG, CHOLHDL, LDLDIRECT in the last 72 hours.  No results found for:  HGBA1C ------------------------------------------------------------------------------------------------------------------ No results for input(s): TSH, T4TOTAL, T3FREE, THYROIDAB in the last 72 hours.  Invalid input(s): FREET3 ------------------------------------------------------------------------------------------------------------------ No results for input(s): VITAMINB12, FOLATE, FERRITIN, TIBC, IRON, RETICCTPCT in the last 72 hours.  Coagulation profile No results for input(s): INR, PROTIME in the last 168 hours.  No results for input(s): DDIMER in the last 72 hours.  Cardiac Enzymes No results for input(s): CKMB, TROPONINI, MYOGLOBIN in the last 168 hours.  Invalid input(s): CK ------------------------------------------------------------------------------------------------------------------ No results found for: BNP  Inpatient Medications  Scheduled Meds: . carvedilol  3.125 mg Oral BID  . dorzolamide-timolol  1 drop Both Eyes BID  . feeding supplement (ENSURE ENLIVE)  237 mL Oral BID BM  . latanoprost  1 drop Both Eyes QHS  . mouth rinse  15 mL Mouth Rinse BID   Continuous Infusions: PRN Meds:.acetaminophen **OR** acetaminophen, glycopyrrolate **OR** glycopyrrolate **OR** glycopyrrolate, haloperidol **OR** haloperidol **OR** haloperidol lactate, LORazepam **OR** LORazepam **OR** LORazepam, morphine injection, ondansetron **OR** ondansetron (ZOFRAN) IV  Micro Results Recent Results (from the past 240 hour(s))  Respiratory Panel by RT PCR (Flu A&B, Covid) - Nasopharyngeal Swab     Status: None   Collection Time: 07/29/19  6:47 PM   Specimen: Nasopharyngeal Swab  Result Value Ref Range Status   SARS Coronavirus 2 by RT PCR NEGATIVE NEGATIVE Final    Comment: (NOTE) SARS-CoV-2 target nucleic acids are NOT DETECTED. The SARS-CoV-2 RNA is generally detectable in upper respiratoy specimens during the acute phase of infection. The lowest concentration of SARS-CoV-2 viral  copies this assay  can detect is 131 copies/mL. A negative result does not preclude SARS-Cov-2 infection and should not be used as the sole basis for treatment or other patient management decisions. A negative result may occur with  improper specimen collection/handling, submission of specimen other than nasopharyngeal swab, presence of viral mutation(s) within the areas targeted by this assay, and inadequate number of viral copies (<131 copies/mL). A negative result must be combined with clinical observations, patient history, and epidemiological information. The expected result is Negative. Fact Sheet for Patients:  https://www.moore.com/ Fact Sheet for Healthcare Providers:  https://www.young.biz/ This test is not yet ap proved or cleared by the [place] FDA and  has been authorized for detection and/or diagnosis of SARS-CoV-2 by FDA under an Emergency Use Authorization (EUA). This EUA will remain  in effect (meaning this test can be used) for the duration of the COVID-19 declaration under Section 564(b)(1) of the Act, 21 U.S.C. section 360bbb-3(b)(1), unless the authorization is terminated or revoked sooner.    Influenza A by PCR NEGATIVE NEGATIVE Final   Influenza B by PCR NEGATIVE NEGATIVE Final    Comment: (NOTE) The Xpert Xpress SARS-CoV-2/FLU/RSV assay is intended as an aid in  the diagnosis of influenza from Nasopharyngeal swab specimens and  should not be used as a sole basis for treatment. Nasal washings and  aspirates are unacceptable for Xpert Xpress SARS-CoV-2/FLU/RSV  testing. Fact Sheet for Patients: https://www.moore.com/ Fact Sheet for Healthcare Providers: https://www.young.biz/ This test is not yet approved or cleared by the [place] FDA and  has been authorized for detection and/or diagnosis of SARS-CoV-2 by  FDA under an Emergency Use Authorization (EUA). This EUA will  remain  in effect (meaning this test can be used) for the duration of the  Covid-19 declaration under Section 564(b)(1) of the Act, 21  U.S.C. section 360bbb-3(b)(1), unless the authorization is  terminated or revoked. Performed at [facility], [address] [place], [place] [address]     Radiology Reports DG Chest 1 View  Result Date: 07/29/2019 CLINICAL DATA:  Weakness EXAM: CHEST  1 VIEW COMPARISON:  None. FINDINGS: Cardiac enlargement. Negative for heart failure. Extensive atherosclerotic calcification ascending aorta compatible with ascending aortic aneurysm. Calcified AP window lymph nodes. Linear densities in the left lung base may represent atelectasis or scarring. No focal pneumonia or effusion. IMPRESSION: Ascending aortic aneurysm.  Negative for heart failure Left lower lobe atelectasis/scarring. Electronically Signed   By: Marlan Palau M.D.   On: 07/29/2019 19:37   CT Head Wo Contrast  Result Date: 07/29/2019 CLINICAL DATA:  86 year old female with a history of a fall EXAM: CT HEAD WITHOUT CONTRAST CT CERVICAL SPINE WITHOUT CONTRAST TECHNIQUE: Multidetector CT imaging of the head and cervical spine was performed following the standard protocol without intravenous contrast. Multiplanar CT image reconstructions of the cervical spine were also generated. COMPARISON:  01/20/2018 FINDINGS: CT HEAD FINDINGS Brain: No acute intracranial hemorrhage. No midline shift or mass effect. Gray-white differentiation maintained. Mild volume loss. Confluent hypodensity in the periventricular white matter, similar to the comparison. Similar configuration of the ventricles. Unremarkable appearance of the ventricular system. Vascular: Calcifications of the anterior vasculature. Skull: No acute fracture.  No aggressive bone lesion identified. Sinuses/Orbits: Unremarkable appearance of the orbits. Mastoid air cells clear. No middle ear effusion. No significant sinus disease. Other: None CT  CERVICAL SPINE FINDINGS Alignment: Craniocervical junction aligned. Anatomic alignment of the cervical elements. No subluxation. Facets aligned. Skull base and vertebrae: No acute fracture at the skullbase. Vertebral body  heights relatively maintained. No acute fracture identified. Soft tissues and spinal canal: Bilateral carotid calcifications. No focal soft tissue swelling. Disc levels: Disc space narrowing throughout the cervical spine. Endplate changes are most pronounced at C2-C3 and C6-C7. Mild uncovertebral joint disease and facet disease of the cervical spine. No bony canal narrowing. Upper chest: Unremarkable appearance of the lung apices. Other: No bony canal narrowing. IMPRESSION: Head CT: No acute intracranial abnormality. Chronic microvascular ischemic disease with associated intracranial atherosclerosis. Cervical CT: No acute fracture or malalignment of the cervical spine. Multilevel degenerative changes. Carotid calcifications. Electronically Signed   By: Gilmer Mor D.O.   On: 07/29/2019 12:25   CT Cervical Spine Wo Contrast  Result Date: 07/29/2019 CLINICAL DATA:  86 year old female with a history of a fall EXAM: CT HEAD WITHOUT CONTRAST CT CERVICAL SPINE WITHOUT CONTRAST TECHNIQUE: Multidetector CT imaging of the head and cervical spine was performed following the standard protocol without intravenous contrast. Multiplanar CT image reconstructions of the cervical spine were also generated. COMPARISON:  01/20/2018 FINDINGS: CT HEAD FINDINGS Brain: No acute intracranial hemorrhage. No midline shift or mass effect. Gray-white differentiation maintained. Mild volume loss. Confluent hypodensity in the periventricular white matter, similar to the comparison. Similar configuration of the ventricles. Unremarkable appearance of the ventricular system. Vascular: Calcifications of the anterior vasculature. Skull: No acute fracture.  No aggressive bone lesion identified. Sinuses/Orbits: Unremarkable  appearance of the orbits. Mastoid air cells clear. No middle ear effusion. No significant sinus disease. Other: None CT CERVICAL SPINE FINDINGS Alignment: Craniocervical junction aligned. Anatomic alignment of the cervical elements. No subluxation. Facets aligned. Skull base and vertebrae: No acute fracture at the skullbase. Vertebral body heights relatively maintained. No acute fracture identified. Soft tissues and spinal canal: Bilateral carotid calcifications. No focal soft tissue swelling. Disc levels: Disc space narrowing throughout the cervical spine. Endplate changes are most pronounced at C2-C3 and C6-C7. Mild uncovertebral joint disease and facet disease of the cervical spine. No bony canal narrowing. Upper chest: Unremarkable appearance of the lung apices. Other: No bony canal narrowing. IMPRESSION: Head CT: No acute intracranial abnormality. Chronic microvascular ischemic disease with associated intracranial atherosclerosis. Cervical CT: No acute fracture or malalignment of the cervical spine. Multilevel degenerative changes. Carotid calcifications. Electronically Signed   By: Gilmer Mor D.O.   On: 07/29/2019 12:25   CT Hip Right Wo Contrast  Result Date: 07/29/2019 CLINICAL DATA:  Acute hip pain EXAM: CT OF THE RIGHT HIP WITHOUT CONTRAST TECHNIQUE: Multidetector CT imaging of the right hip was performed according to the standard protocol. Multiplanar CT image reconstructions were also generated. COMPARISON:  None. FINDINGS: Bones/Joint/Cartilage No fracture or dislocation. Moderate right hip osteoarthritis is seen with superior joint space loss and marginal osteophyte formation. There is diffuse osteopenia. There is sclerosis around the sacroiliac joint and anterior pubic symphysis. No large hip joint effusion is seen. Ligaments Suboptimally assessed by CT. Muscles and Tendons Mild fatty atrophy noted within the muscles surrounding the hip. The tendons appear to be grossly intact. Soft tissues No  focal soft tissue swelling is seen. There are dense vascular calcifications. The visualized portion of the deep pelvis is grossly unremarkable. IMPRESSION: No acute osseous abnormality.  Moderate right hip osteoarthritis. Electronically Signed   By: Jonna Clark M.D.   On: 07/29/2019 17:56   DG HIP UNILAT W OR W/O PELVIS 2-3 VIEWS RIGHT  Result Date: 07/29/2019 CLINICAL DATA:  86 year old female with fall and right hip pain. EXAM: DG HIP (WITH OR WITHOUT PELVIS) 2-3V RIGHT  COMPARISON:  CT of the abdomen pelvis dated 10/27/2017. FINDINGS: There is no acute fracture or dislocation. The bones are osteopenic. There is arthritic changes of the right hip with severe joint space narrowing with near bone-on-bone contact. There is degenerative changes of the visualized lower lumbar spine with scoliosis. The soft tissues are unremarkable. IMPRESSION: 1. No acute fracture or dislocation. 2. Degenerative changes of the right hip with severe narrowing of the joint space. Electronically Signed   By: Elgie Collard M.D.   On: 07/29/2019 15:57    Time Spent in minutes  25   Henlee Donovan M.D on 08/02/2019 at 2:55 PM  Between 7am to 7pm - Pager - [phone]  After 7pm go to [email or web address] - password [facility]  [facility] -  Office  [phone]

## 2019-08-03 DIAGNOSIS — R531 Weakness: Secondary | ICD-10-CM

## 2019-08-03 DIAGNOSIS — T796XXD Traumatic ischemia of muscle, subsequent encounter: Secondary | ICD-10-CM

## 2019-08-03 DIAGNOSIS — E876 Hypokalemia: Secondary | ICD-10-CM

## 2019-08-03 DIAGNOSIS — R627 Adult failure to thrive: Secondary | ICD-10-CM | POA: Diagnosis present

## 2019-08-03 MED ORDER — ENSURE ENLIVE PO LIQD: 237.0000 mL | Freq: Two times a day (BID) | ORAL | 12 refills | Status: AC

## 2019-08-03 MED ORDER — LORAZEPAM 1 MG PO TABS: 1.0000 mg | ORAL_TABLET | ORAL | 0 refills | Status: AC | PRN

## 2019-08-03 MED ORDER — GLYCOPYRROLATE 1 MG PO TABS: 1.0000 mg | ORAL_TABLET | ORAL | 0 refills | Status: AC | PRN

## 2019-08-03 NOTE — Progress Notes (Signed)
Follow up visit made. Patient seen resting in bed, watching television. Writer has spoken to patient's son via telephone, he would like to come to [facility] after the bed in delivered and wait with his mother for EMS. Staff RN Lillia Abed and TOC Bridget Cobb updated. Discharge summary faxed to referral. Dayna Barker BSN, RN, [name] Liaison [facility] [phone]

## 2019-08-03 NOTE — Care Management Important Message (Signed)
Important Message  Patient Details  Name: Cindy James MRN: [id number] Date of Birth: [birth year]   Medicare Important Message Given:  Yes     Johnell Comings 08/03/2019, 2:21 PM

## 2019-08-03 NOTE — Discharge Summary (Signed)
Physician Discharge Summary  Cindy James VVO:[id number] DOB: 1927-02-11 DOA: 07/29/2019  PCP: Lauro Regulus, MD  Admit date: 07/29/2019 Discharge date: 08/03/2019  Admitted From: home Disposition:  Home with hospice  Recommendations for Outpatient Follow-up:  1. Follow up with hospice at home  Home Health:none Equipment/Devices:hospital bed  Discharge Condition:guarded CODE STATUS:DNR Diet recommendation: regular    Discharge Diagnoses:  Principal Problem:   Dementia with behavioral disturbance (HCC)   Active Problems:   Rhabdomyolysis,traumatic   CAD (coronary artery disease)   Falls   Hip pain   Failure to thrive in adult  Brief narrative 86 year old female with severe dementia, progressive, coronary artery disease, hyperlipidemia, GERD, hypertension and B12 since he presented with fall with bruise over the right thigh.  Patient having frequent falls for the past 1 week.  Also was incontinent and tremulous on the morning of admission.  Patient recently received second dose of her COVID-19 vaccine.  She reportedly was having significant functional decline over the past few months. Patient found to have rhabdomyolysis and admitted for further management.  Hospital course Principal problem:    Dementia with behavioral disturbance [facility]) Adult failure to thrive Progressive functional decline.    Patient transition to comfort care after discussion with family. Initially was listed and waiting for hospice home however given some improvement during the hospital stay family wished to take her home with 24/7 care and home hospice. She has not been requiring scheduled liquid morphine and her appetite is improving in the past 24 hours.  Medications adjusted.  Added Ativan as needed for anxiety and glycopyrrolate for secretions.  Active problems   Rhabdomyolysis, traumatic Improved with IV fluids.  Statin discontinued.  Hypokalemia Replenished  Essential  hypertension Continue Coreg.    Medications focused for comfort only.     Family Communication  : discussed with son  Disposition Plan  : Home with home hospice   Consults  : Hospice  Procedures  : None  Discharge Instructions   Allergies as of 08/03/2019      Reactions   Codeine Other (See Comments)   Penicillins    Unknown reaction   Sulfa Antibiotics Nausea Only, Nausea And Vomiting   Tramadol Other (See Comments)      Medication List    STOP taking these medications   aspirin 81 MG EC tablet   cyanocobalamin 1000 MCG tablet   omeprazole 20 MG capsule Commonly known as: PRILOSEC   sertraline 25 MG tablet Commonly known as: ZOLOFT   simvastatin 40 MG tablet Commonly known as: ZOCOR     TAKE these medications   carvedilol 3.125 MG tablet Commonly known as: COREG Take 3.125 mg by mouth 2 (two) times daily.   donepezil 5 MG tablet Commonly known as: ARICEPT Take 5 mg by mouth at bedtime.   dorzolamide-timolol 22.3-6.8 MG/ML ophthalmic solution Commonly known as: COSOPT Place 1 drop into both eyes 2 (two) times daily.   feeding supplement (ENSURE ENLIVE) Liqd Take 237 mLs by mouth 2 (two) times daily between meals.   glycopyrrolate 1 MG tablet Commonly known as: ROBINUL Take 1 tablet (1 mg total) by mouth every 4 (four) hours as needed (excessive secretions).   LORazepam 1 MG tablet Commonly known as: ATIVAN Take 1 tablet (1 mg total) by mouth every 4 (four) hours as needed for anxiety.   Multi-Vitamin tablet Take 1 tablet by mouth daily.   Travoprost (BAK Free) 0.004 % Soln ophthalmic solution Commonly known as: TRAVATAN Place 1 drop  into both eyes at bedtime.      Follow-up Information    hospice at home Follow up in 1 week(s).          Allergies  Allergen Reactions  . Codeine Other (See Comments)  . Penicillins     Unknown reaction  . Sulfa Antibiotics Nausea Only and Nausea And Vomiting  . Tramadol Other (See  Comments)     Procedures/Studies: DG Chest 1 View  Result Date: 07/29/2019 CLINICAL DATA:  Weakness EXAM: CHEST  1 VIEW COMPARISON:  None. FINDINGS: Cardiac enlargement. Negative for heart failure. Extensive atherosclerotic calcification ascending aorta compatible with ascending aortic aneurysm. Calcified AP window lymph nodes. Linear densities in the left lung base may represent atelectasis or scarring. No focal pneumonia or effusion. IMPRESSION: Ascending aortic aneurysm.  Negative for heart failure Left lower lobe atelectasis/scarring. Electronically Signed   By: Marlan Palau M.D.   On: 07/29/2019 19:37   CT Head Wo Contrast  Result Date: 07/29/2019 CLINICAL DATA:  86 year old female with a history of a fall EXAM: CT HEAD WITHOUT CONTRAST CT CERVICAL SPINE WITHOUT CONTRAST TECHNIQUE: Multidetector CT imaging of the head and cervical spine was performed following the standard protocol without intravenous contrast. Multiplanar CT image reconstructions of the cervical spine were also generated. COMPARISON:  01/20/2018 FINDINGS: CT HEAD FINDINGS Brain: No acute intracranial hemorrhage. No midline shift or mass effect. Gray-white differentiation maintained. Mild volume loss. Confluent hypodensity in the periventricular white matter, similar to the comparison. Similar configuration of the ventricles. Unremarkable appearance of the ventricular system. Vascular: Calcifications of the anterior vasculature. Skull: No acute fracture.  No aggressive bone lesion identified. Sinuses/Orbits: Unremarkable appearance of the orbits. Mastoid air cells clear. No middle ear effusion. No significant sinus disease. Other: None CT CERVICAL SPINE FINDINGS Alignment: Craniocervical junction aligned. Anatomic alignment of the cervical elements. No subluxation. Facets aligned. Skull base and vertebrae: No acute fracture at the skullbase. Vertebral body heights relatively maintained. No acute fracture identified. Soft tissues and  spinal canal: Bilateral carotid calcifications. No focal soft tissue swelling. Disc levels: Disc space narrowing throughout the cervical spine. Endplate changes are most pronounced at C2-C3 and C6-C7. Mild uncovertebral joint disease and facet disease of the cervical spine. No bony canal narrowing. Upper chest: Unremarkable appearance of the lung apices. Other: No bony canal narrowing. IMPRESSION: Head CT: No acute intracranial abnormality. Chronic microvascular ischemic disease with associated intracranial atherosclerosis. Cervical CT: No acute fracture or malalignment of the cervical spine. Multilevel degenerative changes. Carotid calcifications. Electronically Signed   By: Gilmer Mor D.O.   On: 07/29/2019 12:25   CT Cervical Spine Wo Contrast  Result Date: 07/29/2019 CLINICAL DATA:  86 year old female with a history of a fall EXAM: CT HEAD WITHOUT CONTRAST CT CERVICAL SPINE WITHOUT CONTRAST TECHNIQUE: Multidetector CT imaging of the head and cervical spine was performed following the standard protocol without intravenous contrast. Multiplanar CT image reconstructions of the cervical spine were also generated. COMPARISON:  01/20/2018 FINDINGS: CT HEAD FINDINGS Brain: No acute intracranial hemorrhage. No midline shift or mass effect. Gray-white differentiation maintained. Mild volume loss. Confluent hypodensity in the periventricular white matter, similar to the comparison. Similar configuration of the ventricles. Unremarkable appearance of the ventricular system. Vascular: Calcifications of the anterior vasculature. Skull: No acute fracture.  No aggressive bone lesion identified. Sinuses/Orbits: Unremarkable appearance of the orbits. Mastoid air cells clear. No middle ear effusion. No significant sinus disease. Other: None CT CERVICAL SPINE FINDINGS Alignment: Craniocervical junction aligned. Anatomic alignment of the  cervical elements. No subluxation. Facets aligned. Skull base and vertebrae: No acute  fracture at the skullbase. Vertebral body heights relatively maintained. No acute fracture identified. Soft tissues and spinal canal: Bilateral carotid calcifications. No focal soft tissue swelling. Disc levels: Disc space narrowing throughout the cervical spine. Endplate changes are most pronounced at C2-C3 and C6-C7. Mild uncovertebral joint disease and facet disease of the cervical spine. No bony canal narrowing. Upper chest: Unremarkable appearance of the lung apices. Other: No bony canal narrowing. IMPRESSION: Head CT: No acute intracranial abnormality. Chronic microvascular ischemic disease with associated intracranial atherosclerosis. Cervical CT: No acute fracture or malalignment of the cervical spine. Multilevel degenerative changes. Carotid calcifications. Electronically Signed   By: Corrie Mckusick D.O.   On: 07/29/2019 12:25   CT Hip Right Wo Contrast  Result Date: 07/29/2019 CLINICAL DATA:  Acute hip pain EXAM: CT OF THE RIGHT HIP WITHOUT CONTRAST TECHNIQUE: Multidetector CT imaging of the right hip was performed according to the standard protocol. Multiplanar CT image reconstructions were also generated. COMPARISON:  None. FINDINGS: Bones/Joint/Cartilage No fracture or dislocation. Moderate right hip osteoarthritis is seen with superior joint space loss and marginal osteophyte formation. There is diffuse osteopenia. There is sclerosis around the sacroiliac joint and anterior pubic symphysis. No large hip joint effusion is seen. Ligaments Suboptimally assessed by CT. Muscles and Tendons Mild fatty atrophy noted within the muscles surrounding the hip. The tendons appear to be grossly intact. Soft tissues No focal soft tissue swelling is seen. There are dense vascular calcifications. The visualized portion of the deep pelvis is grossly unremarkable. IMPRESSION: No acute osseous abnormality.  Moderate right hip osteoarthritis. Electronically Signed   By: Prudencio Pair M.D.   On: 07/29/2019 17:56   DG HIP  UNILAT W OR W/O PELVIS 2-3 VIEWS RIGHT  Result Date: 07/29/2019 CLINICAL DATA:  86 year old female with fall and right hip pain. EXAM: DG HIP (WITH OR WITHOUT PELVIS) 2-3V RIGHT COMPARISON:  CT of the abdomen pelvis dated 10/27/2017. FINDINGS: There is no acute fracture or dislocation. The bones are osteopenic. There is arthritic changes of the right hip with severe joint space narrowing with near bone-on-bone contact. There is degenerative changes of the visualized lower lumbar spine with scoliosis. The soft tissues are unremarkable. IMPRESSION: 1. No acute fracture or dislocation. 2. Degenerative changes of the right hip with severe narrowing of the joint space. Electronically Signed   By: Anner Crete M.D.   On: 07/29/2019 15:57     Subjective: No overnight events.  Patient quite confused at baseline.  Discharge Exam: Vitals:   08/03/19 0436 08/03/19 0802  BP: (!) 160/58 (!) 157/121  Pulse: 72 85  Resp:  18  Temp: 98.3 F (36.8 C) 97.8 F (36.6 C)  SpO2: 95% 99%   Vitals:   08/02/19 1543 08/02/19 1948 08/03/19 0436 08/03/19 0802  BP: (!) 158/65 (!) 149/52 (!) 160/58 (!) 157/121  Pulse: 74 79 72 85  Resp: 17   18  Temp: 97.8 F (36.6 C) 98.3 F (36.8 C) 98.3 F (36.8 C) 97.8 F (36.6 C)  TempSrc: Oral Oral Oral Oral  SpO2: 99% 97% 95% 99%  Weight:      Height:        General: Elderly female, not in distress, confused HEENT: Moist mucosa, supple neck Chest: Clear CVs: Normal S1-S2 GI: Soft, nondistended, nontender Musculoskeletal: Warm, no edema    The results of significant diagnostics from this hospitalization (including imaging, microbiology, ancillary and laboratory) are listed below  for reference.     Microbiology: Recent Results (from the past 240 hour(s))  Respiratory Panel by RT PCR (Flu A&B, Covid) - Nasopharyngeal Swab     Status: None   Collection Time: 07/29/19  6:47 PM   Specimen: Nasopharyngeal Swab  Result Value Ref Range Status   SARS  Coronavirus 2 by RT PCR NEGATIVE NEGATIVE Final    Comment: (NOTE) SARS-CoV-2 target nucleic acids are NOT DETECTED. The SARS-CoV-2 RNA is generally detectable in upper respiratoy specimens during the acute phase of infection. The lowest concentration of SARS-CoV-2 viral copies this assay can detect is 131 copies/mL. A negative result does not preclude SARS-Cov-2 infection and should not be used as the sole basis for treatment or other patient management decisions. A negative result may occur with  improper specimen collection/handling, submission of specimen other than nasopharyngeal swab, presence of viral mutation(s) within the areas targeted by this assay, and inadequate number of viral copies (<131 copies/mL). A negative result must be combined with clinical observations, patient history, and epidemiological information. The expected result is Negative. Fact Sheet for Patients:  https://www.moore.com/ Fact Sheet for Healthcare Providers:  https://www.young.biz/ This test is not yet ap proved or cleared by the [place] FDA and  has been authorized for detection and/or diagnosis of SARS-CoV-2 by FDA under an Emergency Use Authorization (EUA). This EUA will remain  in effect (meaning this test can be used) for the duration of the COVID-19 declaration under Section 564(b)(1) of the Act, 21 U.S.C. section 360bbb-3(b)(1), unless the authorization is terminated or revoked sooner.    Influenza A by PCR NEGATIVE NEGATIVE Final   Influenza B by PCR NEGATIVE NEGATIVE Final    Comment: (NOTE) The Xpert Xpress SARS-CoV-2/FLU/RSV assay is intended as an aid in  the diagnosis of influenza from Nasopharyngeal swab specimens and  should not be used as a sole basis for treatment. Nasal washings and  aspirates are unacceptable for Xpert Xpress SARS-CoV-2/FLU/RSV  testing. Fact Sheet for Patients: https://www.moore.com/ Fact Sheet  for Healthcare Providers: https://www.young.biz/ This test is not yet approved or cleared by the [place] FDA and  has been authorized for detection and/or diagnosis of SARS-CoV-2 by  FDA under an Emergency Use Authorization (EUA). This EUA will remain  in effect (meaning this test can be used) for the duration of the  Covid-19 declaration under Section 564(b)(1) of the Act, 21  U.S.C. section 360bbb-3(b)(1), unless the authorization is  terminated or revoked. Performed at [facility], [address] [place], [place] [address]      Labs: BNP (last 3 results) No results for input(s): BNP in the last 8760 hours. Basic Metabolic Panel: Recent Labs  Lab 07/29/19 1158 07/29/19 1847 07/30/19 0513  NA 134*  --  138  K 3.6  --  3.4*  CL 100  --  107  CO2 22  --  22  GLUCOSE 109*  --  98  BUN 19  --  16  CREATININE 0.82  --  0.72  CALCIUM 9.5  --  8.6*  MG  --  1.6* 1.8  PHOS  --  2.6 2.3*   Liver Function Tests: Recent Labs  Lab 07/29/19 1847 07/30/19 0513  AST 42* 36  ALT 26 22  ALKPHOS 68 61  BILITOT 1.8* 1.7*  PROT 6.6 6.1*  ALBUMIN 3.4* 3.1*   No results for input(s): LIPASE, AMYLASE in the last 168 hours. No results for input(s): AMMONIA in the last 168 hours. CBC: Recent Labs  Lab 07/29/19 1158 07/30/19 0513  WBC 6.4 6.3  HGB 13.3 11.6*  HCT 40.1 34.9*  MCV 94.4 94.8  PLT 225 221   Cardiac Enzymes: Recent Labs  Lab 07/29/19 1158 07/29/19 1847 07/30/19 0513  CKTOTAL 1,048* 1,075* 996*   BNP: Invalid input(s): POCBNP CBG: No results for input(s): GLUCAP in the last 168 hours. D-Dimer No results for input(s): DDIMER in the last 72 hours. Hgb A1c No results for input(s): HGBA1C in the last 72 hours. Lipid Profile No results for input(s): CHOL, HDL, LDLCALC, TRIG, CHOLHDL, LDLDIRECT in the last 72 hours. Thyroid function studies No results for input(s): TSH, T4TOTAL, T3FREE, THYROIDAB in the last 72  hours.  Invalid input(s): FREET3 Anemia work up No results for input(s): VITAMINB12, FOLATE, FERRITIN, TIBC, IRON, RETICCTPCT in the last 72 hours. Urinalysis    Component Value Date/Time   COLORURINE YELLOW (A) 07/29/2019 1603   APPEARANCEUR HAZY (A) 07/29/2019 1603   LABSPEC 1.019 07/29/2019 1603   PHURINE 5.0 07/29/2019 1603   GLUCOSEU NEGATIVE 07/29/2019 1603   HGBUR NEGATIVE 07/29/2019 1603   BILIRUBINUR NEGATIVE 07/29/2019 1603   KETONESUR 20 (A) 07/29/2019 1603   PROTEINUR NEGATIVE 07/29/2019 1603   NITRITE NEGATIVE 07/29/2019 1603   LEUKOCYTESUR NEGATIVE 07/29/2019 1603   Sepsis Labs Invalid input(s): PROCALCITONIN,  WBC,  LACTICIDVEN Microbiology Recent Results (from the past 240 hour(s))  Respiratory Panel by RT PCR (Flu A&B, Covid) - Nasopharyngeal Swab     Status: None   Collection Time: 07/29/19  6:47 PM   Specimen: Nasopharyngeal Swab  Result Value Ref Range Status   SARS Coronavirus 2 by RT PCR NEGATIVE NEGATIVE Final    Comment: (NOTE) SARS-CoV-2 target nucleic acids are NOT DETECTED. The SARS-CoV-2 RNA is generally detectable in upper respiratoy specimens during the acute phase of infection. The lowest concentration of SARS-CoV-2 viral copies this assay can detect is 131 copies/mL. A negative result does not preclude SARS-Cov-2 infection and should not be used as the sole basis for treatment or other patient management decisions. A negative result may occur with  improper specimen collection/handling, submission of specimen other than nasopharyngeal swab, presence of viral mutation(s) within the areas targeted by this assay, and inadequate number of viral copies (<131 copies/mL). A negative result must be combined with clinical observations, patient history, and epidemiological information. The expected result is Negative. Fact Sheet for Patients:  https://www.moore.com/https://www.fda.gov/media/142436/download Fact Sheet for Healthcare Providers:   https://www.young.biz/https://www.fda.gov/media/142435/download This test is not yet ap proved or cleared by the [place] FDA and  has been authorized for detection and/or diagnosis of SARS-CoV-2 by FDA under an Emergency Use Authorization (EUA). This EUA will remain  in effect (meaning this test can be used) for the duration of the COVID-19 declaration under Section 564(b)(1) of the Act, 21 U.S.C. section 360bbb-3(b)(1), unless the authorization is terminated or revoked sooner.    Influenza A by PCR NEGATIVE NEGATIVE Final   Influenza B by PCR NEGATIVE NEGATIVE Final    Comment: (NOTE) The Xpert Xpress SARS-CoV-2/FLU/RSV assay is intended as an aid in  the diagnosis of influenza from Nasopharyngeal swab specimens and  should not be used as a sole basis for treatment. Nasal washings and  aspirates are unacceptable for Xpert Xpress SARS-CoV-2/FLU/RSV  testing. Fact Sheet for Patients: https://www.moore.com/https://www.fda.gov/media/142436/download Fact Sheet for Healthcare Providers: https://www.young.biz/https://www.fda.gov/media/142435/download This test is not yet approved or cleared by the [place] FDA and  has been authorized for detection and/or diagnosis of SARS-CoV-2 by  FDA under an Emergency Use  Authorization (EUA). This EUA will remain  in effect (meaning this test can be used) for the duration of the  Covid-19 declaration under Section 564(b)(1) of the Act, 21  U.S.C. section 360bbb-3(b)(1), unless the authorization is  terminated or revoked. Performed at [facility], [address] [place], [place] [address]      Time coordinating discharge: 35 minutes  SIGNED:   Eddie North, MD  [facility] 08/03/2019, 10:34 AM Pager   If 7PM-7AM, please contact night-coverage [email or web address] Password [email or web address]

## 2019-08-03 NOTE — Clinical Social Work Note (Signed)
EMS has been called and scheduled to transport pt home.   [place], [place] [phone]

## 2019-08-23 DEATH — deceased

## 2021-10-16 IMAGING — CR DG CHEST 1V
1 series · 1 of 1 positions shown · non-contrast
Comparison: None.

CLINICAL DATA: Weakness

EXAM:
CHEST  1 VIEW

[chest ap]
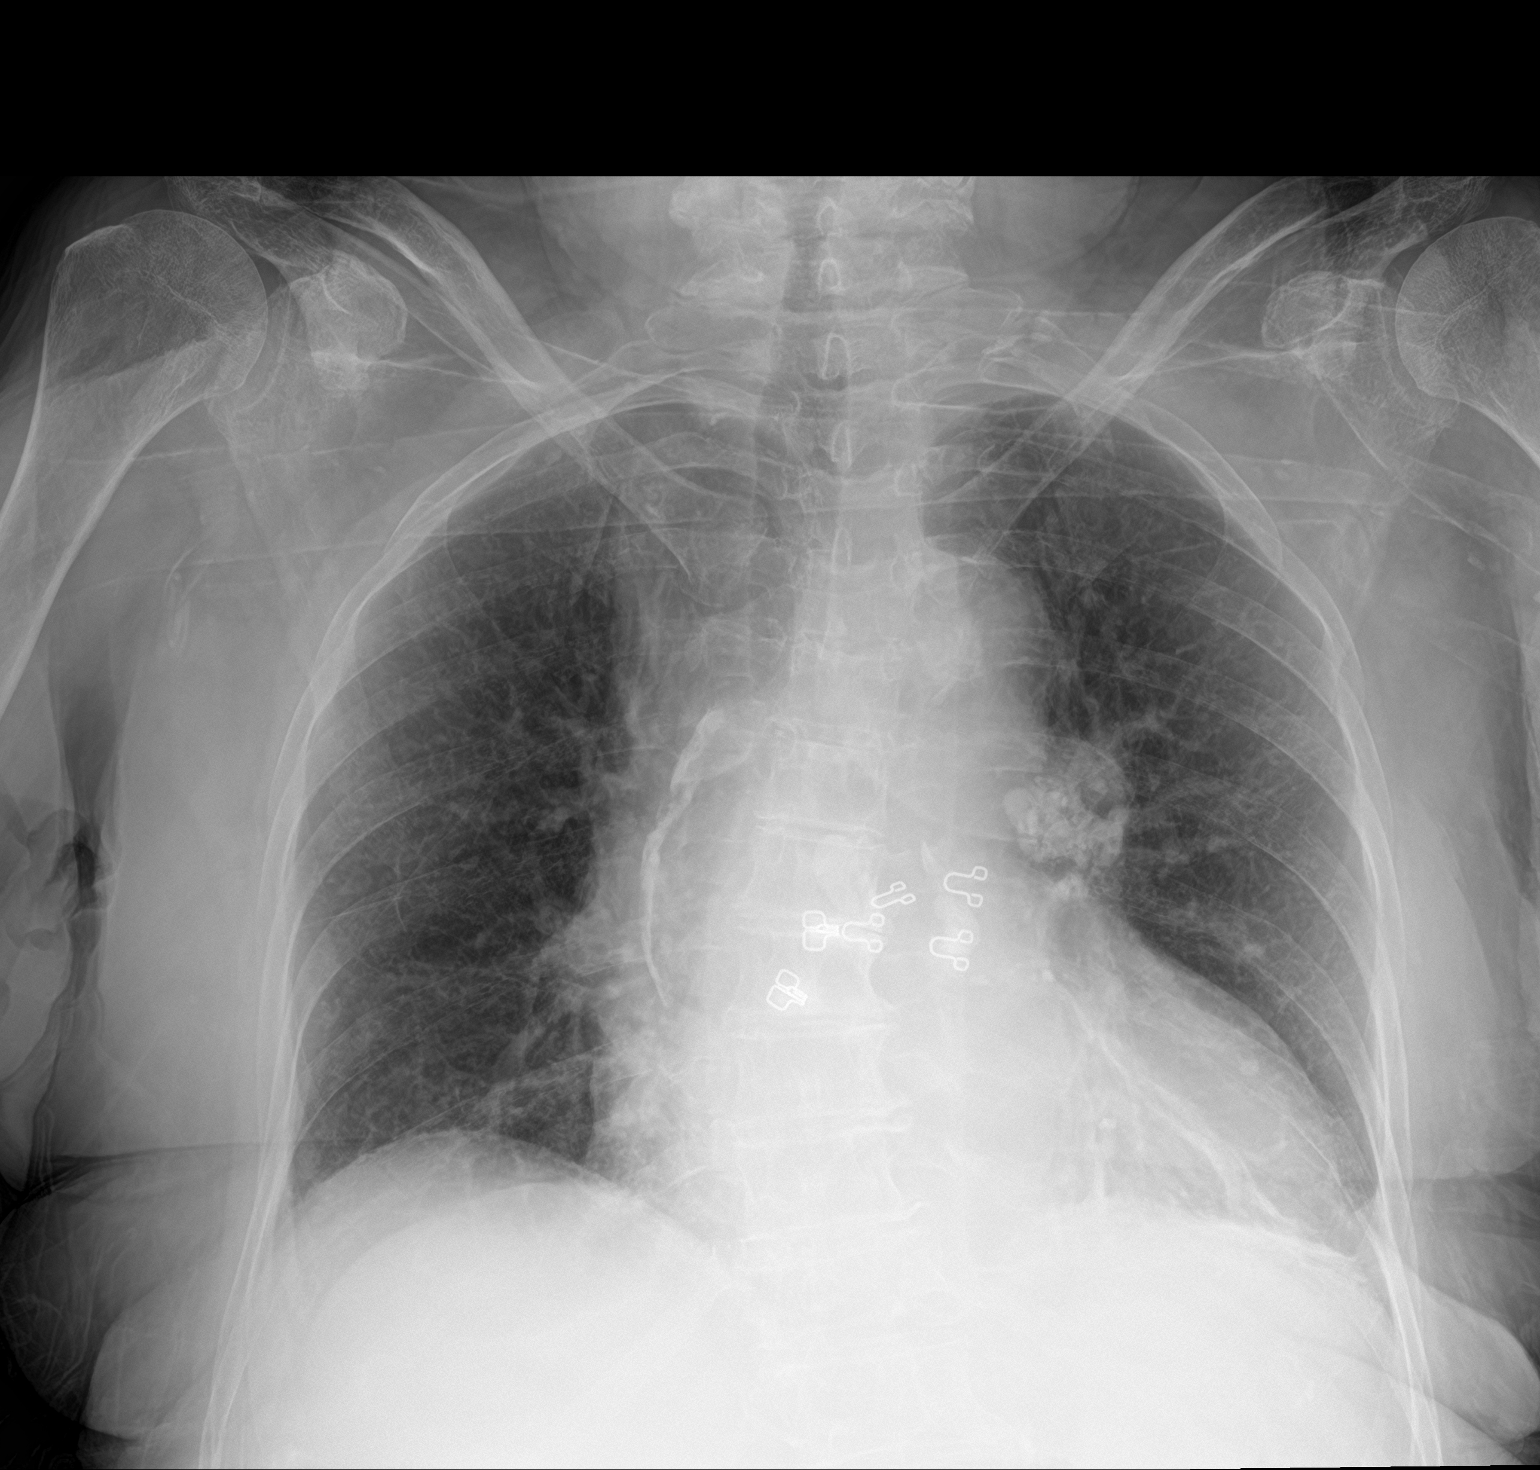

[1 of 1 positions shown; findings below may reference images not displayed]

FINDINGS: Cardiac enlargement. Negative for heart failure. Extensive
atherosclerotic calcification ascending aorta compatible with
ascending aortic aneurysm. Calcified AP window lymph nodes.

Linear densities in the left lung base may represent atelectasis or
scarring. No focal pneumonia or effusion.
IMPRESSION: Ascending aortic aneurysm.  Negative for heart failure

Left lower lobe atelectasis/scarring.

## 2021-10-16 IMAGING — CT CT HIP*R* W/O CM
2 of 5 series · 16 of 46 positions shown, 18 images · non-contrast
Comparison: None.

CLINICAL DATA: Acute hip pain

EXAM:
CT OF THE RIGHT HIP WITHOUT CONTRAST
TECHNIQUE: Multidetector CT imaging of the right hip was performed according to
the standard protocol. Multiplanar CT image reconstructions were
also generated.

[Series 3: axial st · axial · 0.48mm/px · z∈[-1225,-1059]mm · 13 of 97 slices shown, 15 images]
[im 7/97  soft-tissue]
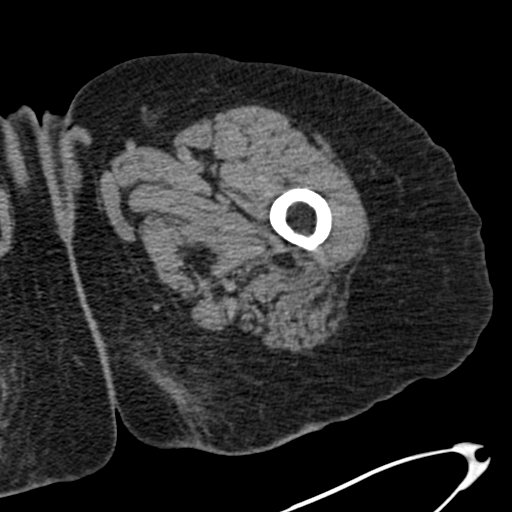
[im 7/97  bone]
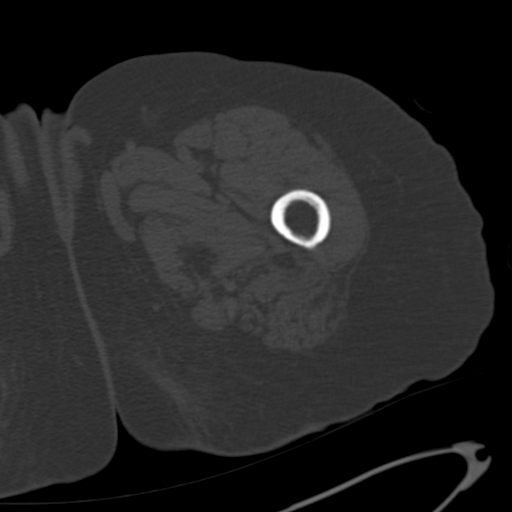
[im 13/97  soft-tissue]
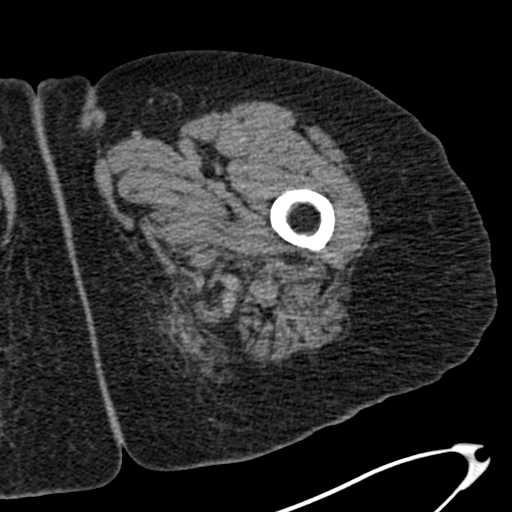
[im 20/97  soft-tissue]
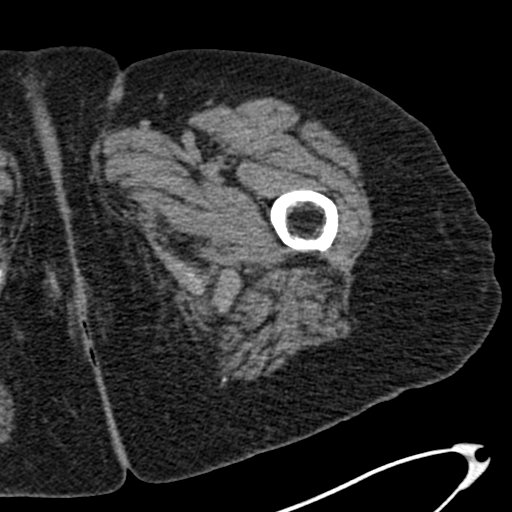
[im 26/97  soft-tissue]
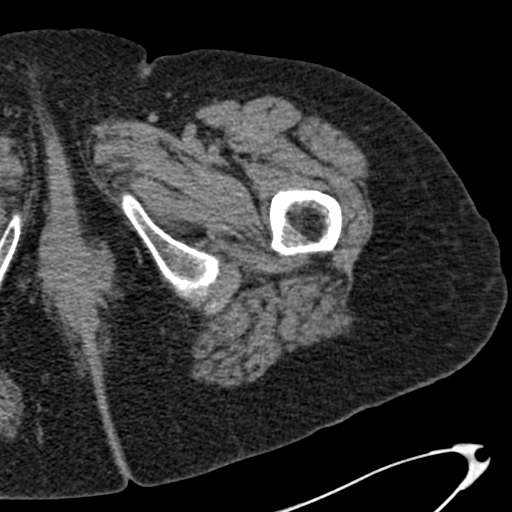
[im 33/97  soft-tissue]
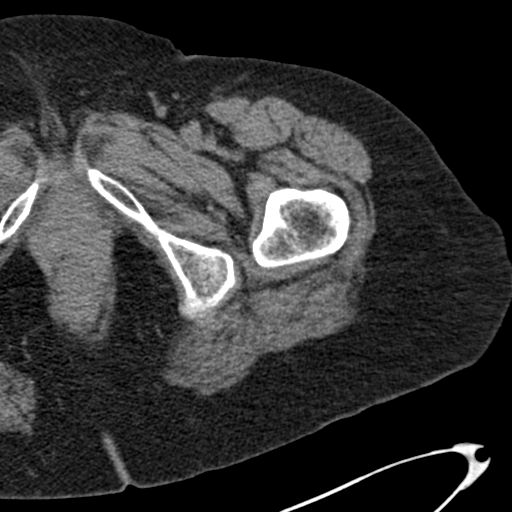
[im 39/97  soft-tissue]
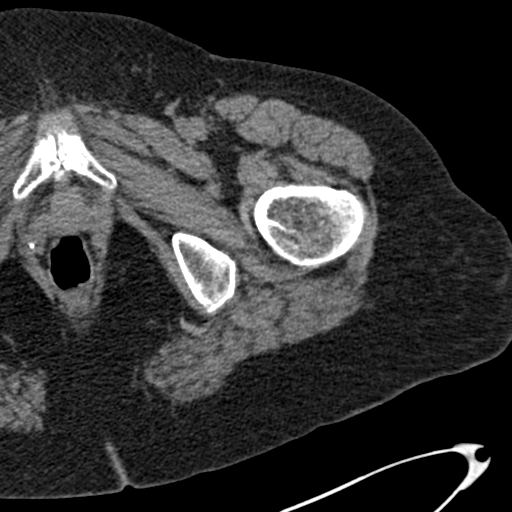
[im 52/97  soft-tissue]
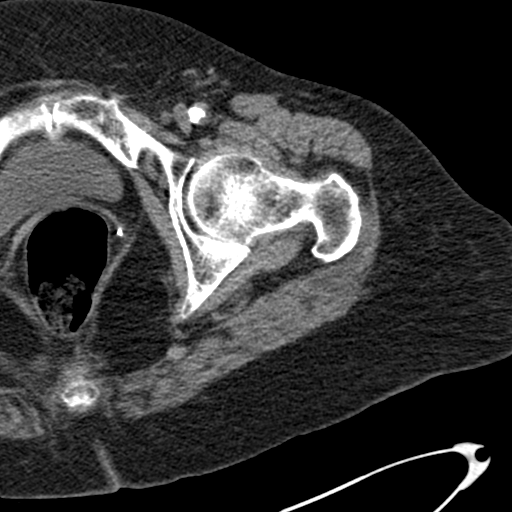
[im 58/97  soft-tissue]
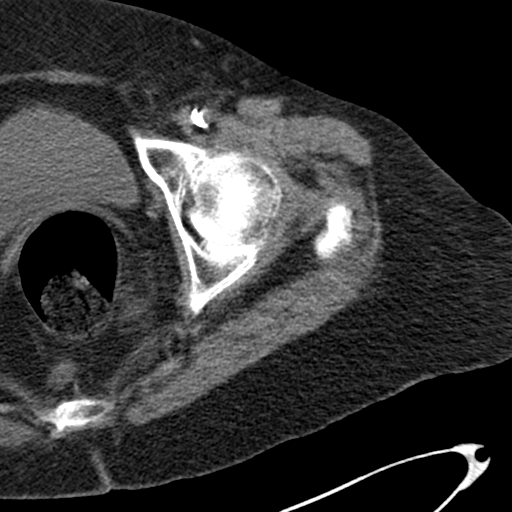
[im 65/97  soft-tissue]
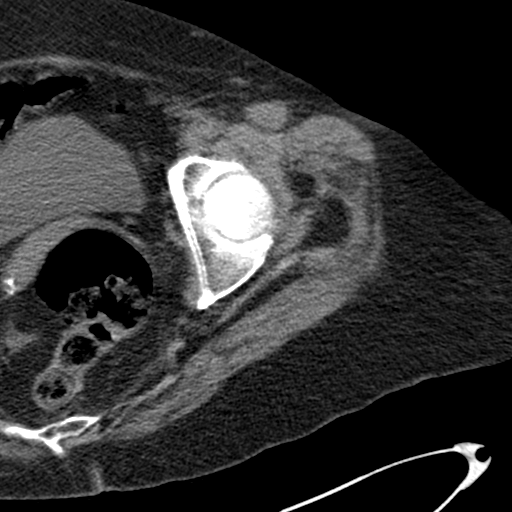
[im 65/97  bone]
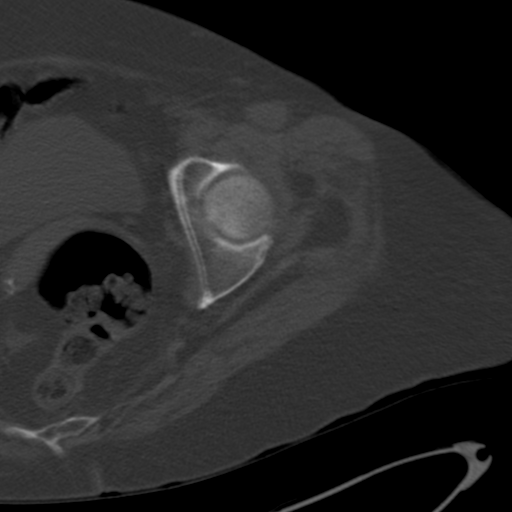
[im 71/97  soft-tissue]
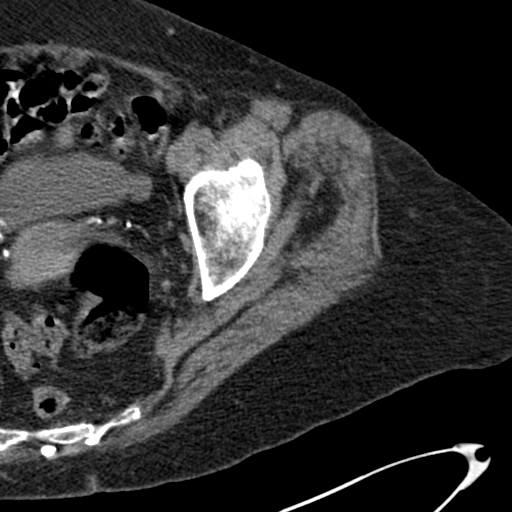
[im 77/97  soft-tissue]
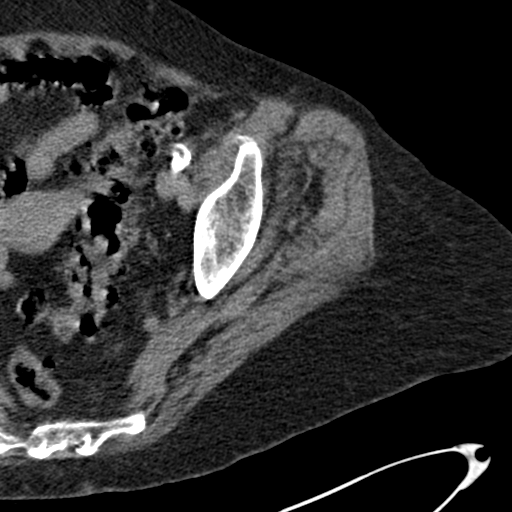
[im 84/97  soft-tissue]
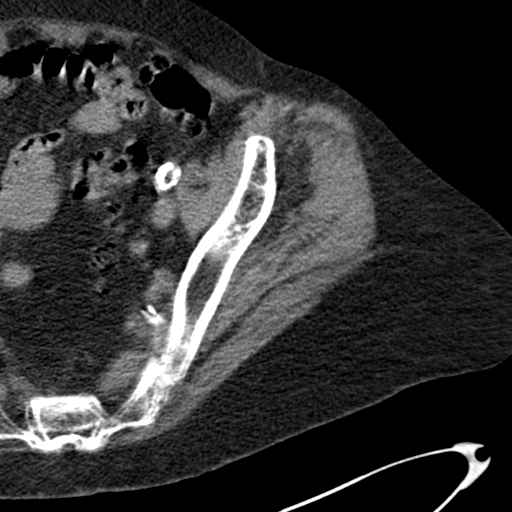
[im 90/97  soft-tissue]
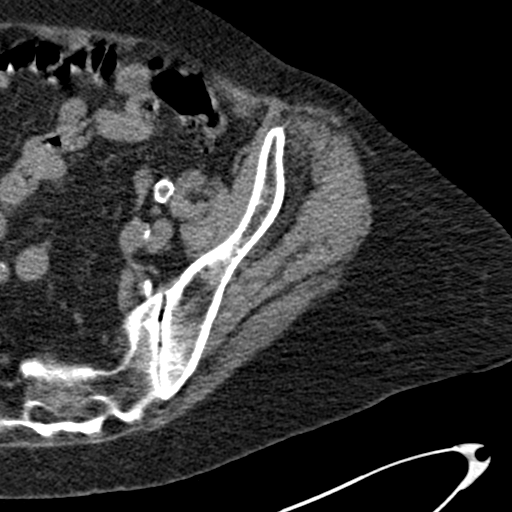

[Series 9: coronal st · coronal · 0.37mm/px · 3 of 95 slices shown]
[im 24/95  soft-tissue]
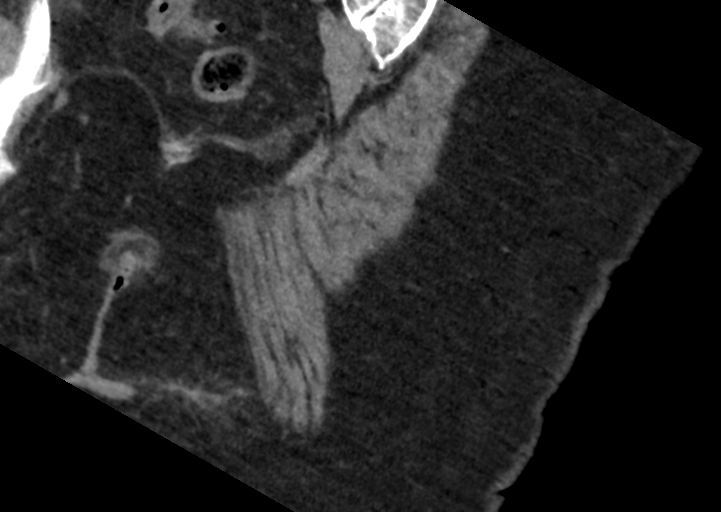
[im 48/95  soft-tissue]
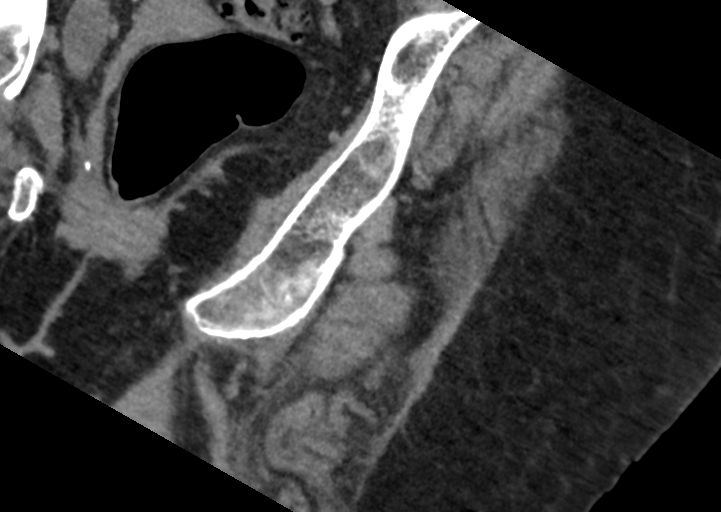
[im 71/95  soft-tissue]
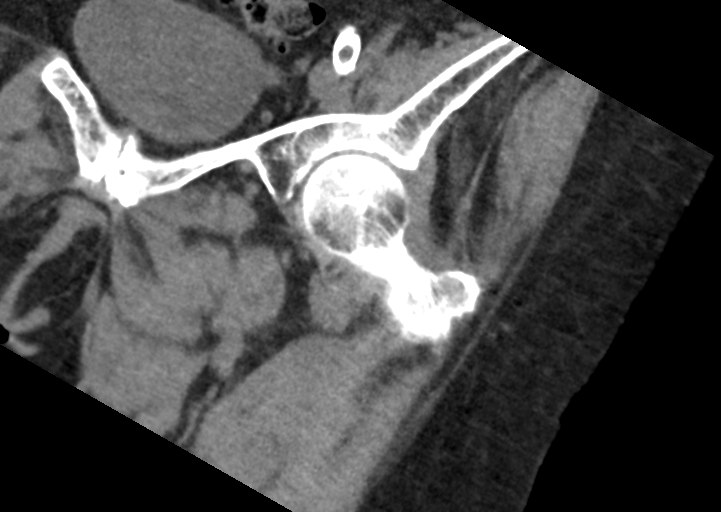

[16 of 46 positions shown; findings below may reference images not displayed]

FINDINGS: Bones/Joint/Cartilage

No fracture or dislocation. Moderate right hip osteoarthritis is
seen with superior joint space loss and marginal osteophyte
formation. There is diffuse osteopenia. There is sclerosis around
the sacroiliac joint and anterior pubic symphysis. No large hip
joint effusion is seen.

Ligaments

Suboptimally assessed by CT.

Muscles and Tendons

Mild fatty atrophy noted within the muscles surrounding the hip. The
tendons appear to be grossly intact.

Soft tissues

No focal soft tissue swelling is seen. There are dense vascular
calcifications. The visualized portion of the deep pelvis is grossly
unremarkable.
IMPRESSION: No acute osseous abnormality.  Moderate right hip osteoarthritis.
